# Patient Record
Sex: Female | Born: 1985
Health system: Southern US, Community
[De-identification: ages and names within clinical notes are randomized; demographics above are authoritative.]

## PROBLEM LIST (undated history)

## (undated) DIAGNOSIS — R51 Headache: Secondary | ICD-10-CM

## (undated) DIAGNOSIS — E669 Obesity, unspecified: Secondary | ICD-10-CM

## (undated) DIAGNOSIS — R519 Headache, unspecified: Secondary | ICD-10-CM

## (undated) DIAGNOSIS — M549 Dorsalgia, unspecified: Secondary | ICD-10-CM

## (undated) DIAGNOSIS — N9489 Other specified conditions associated with female genital organs and menstrual cycle: Secondary | ICD-10-CM

## (undated) HISTORY — DX: Headache: R51

## (undated) HISTORY — PX: BARTHOLIN GLAND CYST EXCISION: SHX565

## (undated) HISTORY — DX: Other specified conditions associated with female genital organs and menstrual cycle: N94.89

## (undated) HISTORY — DX: Headache, unspecified: R51.9

---

## 2009-04-13 ENCOUNTER — Emergency Department (HOSPITAL_BASED_OUTPATIENT_CLINIC_OR_DEPARTMENT_OTHER): Admission: EM | Admit: 2009-04-13 | Discharge: 2009-04-13 | Payer: Self-pay | Admitting: Emergency Medicine

## 2009-04-14 ENCOUNTER — Emergency Department (HOSPITAL_BASED_OUTPATIENT_CLINIC_OR_DEPARTMENT_OTHER): Admission: EM | Admit: 2009-04-14 | Discharge: 2009-04-14 | Payer: Self-pay | Admitting: Emergency Medicine

## 2010-04-26 ENCOUNTER — Emergency Department (HOSPITAL_BASED_OUTPATIENT_CLINIC_OR_DEPARTMENT_OTHER): Admission: EM | Admit: 2010-04-26 | Discharge: 2010-04-26 | Payer: Self-pay | Admitting: Emergency Medicine

## 2010-06-29 ENCOUNTER — Emergency Department (HOSPITAL_BASED_OUTPATIENT_CLINIC_OR_DEPARTMENT_OTHER): Admission: EM | Admit: 2010-06-29 | Discharge: 2010-06-30 | Payer: Self-pay | Admitting: Emergency Medicine

## 2010-06-30 ENCOUNTER — Ambulatory Visit: Payer: Self-pay | Admitting: Interventional Radiology

## 2010-12-15 ENCOUNTER — Emergency Department (HOSPITAL_BASED_OUTPATIENT_CLINIC_OR_DEPARTMENT_OTHER)
Admission: EM | Admit: 2010-12-15 | Discharge: 2010-12-15 | Payer: Self-pay | Source: Home / Self Care | Admitting: Emergency Medicine

## 2010-12-16 ENCOUNTER — Emergency Department (HOSPITAL_BASED_OUTPATIENT_CLINIC_OR_DEPARTMENT_OTHER)
Admission: EM | Admit: 2010-12-16 | Discharge: 2010-12-16 | Payer: Self-pay | Source: Home / Self Care | Admitting: Emergency Medicine

## 2010-12-20 LAB — CBC
HCT: 39 % (ref 36.0–46.0)
MCH: 28.5 pg (ref 26.0–34.0)
MCHC: 32.8 g/dL (ref 30.0–36.0)
MCV: 86.9 fL (ref 78.0–100.0)

## 2011-02-14 LAB — URINE MICROSCOPIC-ADD ON

## 2011-02-14 LAB — URINALYSIS, ROUTINE W REFLEX MICROSCOPIC
Bilirubin Urine: NEGATIVE
Glucose, UA: NEGATIVE mg/dL
Nitrite: NEGATIVE
Protein, ur: NEGATIVE mg/dL
Urobilinogen, UA: 1 mg/dL (ref 0.0–1.0)
pH: 6 (ref 5.0–8.0)

## 2011-02-14 LAB — PREGNANCY, URINE: Preg Test, Ur: NEGATIVE

## 2011-08-12 ENCOUNTER — Encounter: Payer: Self-pay | Admitting: Family Medicine

## 2011-08-12 ENCOUNTER — Emergency Department (HOSPITAL_BASED_OUTPATIENT_CLINIC_OR_DEPARTMENT_OTHER)
Admission: EM | Admit: 2011-08-12 | Discharge: 2011-08-12 | Disposition: A | Discharge status: Home / Self Care | Payer: Managed Care, Other (non HMO) | Attending: Emergency Medicine | Admitting: Emergency Medicine

## 2011-08-12 DIAGNOSIS — R21 Rash and other nonspecific skin eruption: Secondary | ICD-10-CM | POA: Insufficient documentation

## 2011-08-12 DIAGNOSIS — L509 Urticaria, unspecified: Secondary | ICD-10-CM | POA: Insufficient documentation

## 2011-08-12 MED ORDER — PREDNISONE 20 MG PO TABS
100.0000 mg | ORAL_TABLET | Freq: Once | ORAL | Status: AC
  Administered 2011-08-12: 100 mg via ORAL
  Filled 2011-08-12: qty 5

## 2011-08-12 MED ORDER — PREDNISONE 10 MG PO TABS: 50.0000 mg | ORAL_TABLET | Freq: Two times a day (BID) | ORAL | Status: AC

## 2011-08-12 MED ORDER — HYDROXYZINE HCL 25 MG PO TABS
25.0000 mg | ORAL_TABLET | Freq: Once | ORAL | Status: AC
  Administered 2011-08-12: 25 mg via ORAL
  Filled 2011-08-12: qty 1

## 2011-08-12 NOTE — ED Notes (Signed)
MD at bedside. 

## 2011-08-12 NOTE — ED Notes (Signed)
Pt c/o severe itching and hives since this morning. Pt sts she had similar incident last week and "they went away".

## 2011-08-12 NOTE — ED Provider Notes (Addendum)
History     CSN: [id number] Arrival date & time: 08/12/2011  7:46 AM   Chief Complaint  Patient presents with  . Urticaria     (Include location/radiation/quality/duration/timing/severity/associated sxs/prior treatment) HPI  The patient is a 25 year old female with no significant past medical history.  She presents to the emergency department complaining of upper rash for approximately one week.  She denies tightness in her throat, shortness of breath, lightheadedness, nausea, vomiting, or abdominal pain.  She denies a cough.  She does not know of any exposures recently.  She has not changed her diet.  She has not changed any of her detergents or beauty products.  She has not had similar reactions in the past.  She has been using Benadryl.  However, she has not gotten any relief from the itching.   History reviewed. No pertinent past medical history.   Past Surgical History  Procedure Date  . Bartholin gland cyst excision     No family history on file.  History  Substance Use Topics  . Smoking status: Never Smoker   . Smokeless tobacco: Not on file  . Alcohol Use: No    OB History    Grav Para Term Preterm Abortions TAB SAB Ect Mult Living                  Review of Systems  HENT: Positive for congestion. Negative for rhinorrhea and sneezing.   Eyes: Negative for itching.  Respiratory: Negative for cough, chest tightness and shortness of breath.   Cardiovascular: Negative for chest pain and palpitations.  Gastrointestinal: Negative for nausea, vomiting and abdominal pain.  Skin: Positive for rash.       Itching rash    Allergies  Review of patient's allergies indicates no known allergies.  Home Medications   Current Outpatient Rx  Name Route Sig Dispense Refill  . PREDNISONE 10 MG PO TABS Oral Take 5 tablets (50 mg total) by mouth 2 (two) times daily. 10 tablet 0    Physical Exam    BP 148/91  Pulse 100  Temp(Src) 98.3 F (36.8 C) (Oral)  Resp 18  Ht  5\' 9"  (1.753 m)  Wt 290 lb (131.543 kg)  BMI 42.83 kg/m2  SpO2 100%  Physical Exam  Constitutional: She is oriented to person, place, and time. She appears well-developed and well-nourished.       Morbidly obese scratching  HENT:  Head: Normocephalic and atraumatic.  Mouth/Throat: Oropharynx is clear and moist. No oropharyngeal exudate.       No edema in the oropharynx  Eyes: Pupils are equal, round, and reactive to light.  Neck: Normal range of motion.  Cardiovascular: Normal rate, regular rhythm and normal heart sounds.   No murmur heard. Pulmonary/Chest: Effort normal and breath sounds normal. No respiratory distress. She has no wheezes. She has no rales.  Abdominal: She exhibits no distension and no mass. There is no tenderness. There is no rebound and no guarding.  Musculoskeletal: Normal range of motion. She exhibits no edema and no tenderness.  Neurological: She is alert and oriented to person, place, and time. No cranial nerve deficit.  Skin: Rash noted. No erythema.       Hives  Psychiatric: She has a normal mood and affect. Her behavior is normal.    ED Course  Procedures  Hives with an unknown etiology.  No signs of anaphylaxis.  No testing or intervention in the ED necessary.  Other than oral steroids  Results for orders  placed during the hospital encounter of 12/16/10  GLUCOSE, CAPILLARY      Component Value Range   Glucose-Capillary 88  70 - 99 (mg/dL)  CBC      Component Value Range   WBC 9.0  4.0 - 10.5 (K/uL)   RBC 4.49  3.87 - 5.11 (MIL/uL)   Hemoglobin 12.8  12.0 - 15.0 (g/dL)   HCT 16.1  09.6 - 04.5 (%)   MCV 86.9  78.0 - 100.0 (fL)   MCH 28.5  26.0 - 34.0 (pg)   MCHC 32.8  30.0 - 36.0 (g/dL)   RDW 40.9  81.1 - 91.4 (%)   Platelets 353  150 - 400 (K/uL)   No results found.   1. Hives      MDM Urticaria No signs of anaphylaxis       Nicholes Stairs, MD 08/12/11 7829  Nicholes Stairs, MD 08/12/11 [phone]

## 2011-11-13 ENCOUNTER — Encounter (HOSPITAL_BASED_OUTPATIENT_CLINIC_OR_DEPARTMENT_OTHER): Payer: Self-pay | Admitting: Emergency Medicine

## 2011-11-13 ENCOUNTER — Emergency Department (HOSPITAL_BASED_OUTPATIENT_CLINIC_OR_DEPARTMENT_OTHER)
Admission: EM | Admit: 2011-11-13 | Discharge: 2011-11-13 | Disposition: A | Discharge status: Home / Self Care | Payer: Managed Care, Other (non HMO) | Attending: Emergency Medicine | Admitting: Emergency Medicine

## 2011-11-13 DIAGNOSIS — R1013 Epigastric pain: Secondary | ICD-10-CM | POA: Insufficient documentation

## 2011-11-13 DIAGNOSIS — K529 Noninfective gastroenteritis and colitis, unspecified: Secondary | ICD-10-CM

## 2011-11-13 DIAGNOSIS — R112 Nausea with vomiting, unspecified: Secondary | ICD-10-CM | POA: Insufficient documentation

## 2011-11-13 DIAGNOSIS — K5289 Other specified noninfective gastroenteritis and colitis: Secondary | ICD-10-CM | POA: Insufficient documentation

## 2011-11-13 LAB — URINALYSIS, ROUTINE W REFLEX MICROSCOPIC
Glucose, UA: NEGATIVE mg/dL
Leukocytes, UA: NEGATIVE
Nitrite: NEGATIVE
Specific Gravity, Urine: 1.024 (ref 1.005–1.030)
Urobilinogen, UA: 0.2 mg/dL (ref 0.0–1.0)

## 2011-11-13 LAB — PREGNANCY, URINE: Preg Test, Ur: NEGATIVE

## 2011-11-13 LAB — COMPREHENSIVE METABOLIC PANEL
ALT: 42 U/L — ABNORMAL HIGH (ref 0–35)
Albumin: 3.9 g/dL (ref 3.5–5.2)
Alkaline Phosphatase: 91 U/L (ref 39–117)
BUN: 12 mg/dL (ref 6–23)
CO2: 26 mEq/L (ref 19–32)
Calcium: 9.4 mg/dL (ref 8.4–10.5)
Chloride: 105 mEq/L (ref 96–112)
GFR calc Af Amer: >90 mL/min (ref >90)

## 2011-11-13 LAB — CBC
HCT: 38.8 % (ref 36.0–46.0)
Hemoglobin: 12.9 g/dL (ref 12.0–15.0)
MCV: 87 fL (ref 78.0–100.0)
RDW: 14.5 % (ref 11.5–15.5)

## 2011-11-13 LAB — DIFFERENTIAL
Basophils Absolute: 0 10*3/uL (ref 0.0–0.1)
Monocytes Absolute: 0.8 10*3/uL (ref 0.1–1.0)

## 2011-11-13 LAB — LIPASE, BLOOD: Lipase: 22 U/L (ref 11–59)

## 2011-11-13 MED ORDER — ONDANSETRON 8 MG PO TBDP: 8.0000 mg | ORAL_TABLET | Freq: Three times a day (TID) | ORAL | Status: AC | PRN

## 2011-11-13 MED ORDER — HYDROMORPHONE HCL PF 1 MG/ML IJ SOLN
1.0000 mg | Freq: Once | INTRAMUSCULAR | Status: AC
  Administered 2011-11-13: 1 mg via INTRAVENOUS
  Filled 2011-11-13: qty 1

## 2011-11-13 MED ORDER — HYDROCODONE-ACETAMINOPHEN 5-325 MG PO TABS: 1.0000 | ORAL_TABLET | Freq: Four times a day (QID) | ORAL | Status: AC | PRN

## 2011-11-13 MED ORDER — SODIUM CHLORIDE 0.9 % IV BOLUS (SEPSIS)
1000.0000 mL | Freq: Once | INTRAVENOUS | Status: AC
  Administered 2011-11-13: 1000 mL via INTRAVENOUS

## 2011-11-13 MED ORDER — ONDANSETRON HCL 4 MG/2ML IJ SOLN
4.0000 mg | Freq: Once | INTRAMUSCULAR | Status: AC
  Administered 2011-11-13: 4 mg via INTRAVENOUS
  Filled 2011-11-13 (×2): qty 2

## 2011-11-13 NOTE — ED Notes (Signed)
N/V/D and abdominal pain since this am.  No known fever.  Abdominal cramping radiating through to back.  No dysuria.

## 2011-11-13 NOTE — ED Notes (Signed)
Care plan reviewed family at side

## 2011-11-13 NOTE — ED Provider Notes (Signed)
History     CSN: [id number] Arrival date & time: 11/13/2011 12:42 PM   First MD Initiated Contact with Patient 11/13/11 1253      1:15 PM HPI This reports being woken by her daughter this morning and discovered she had upper abdominal pain that radiates to her entire abdomen. Reports acutely became nauseous and vomited. Also reports having diarrhea. Denies history of surgeries, similar symptoms, urinary symptoms, vaginal symptoms, back pain, fever,  sick contacts, food contacts or recent foreign travel.   Patient is a 25 y.o. female presenting with abdominal pain. The history is provided by the patient.  Abdominal Pain The primary symptoms of the illness include abdominal pain, nausea, vomiting and diarrhea. The primary symptoms of the illness do not include fever, fatigue, shortness of breath, hematemesis, dysuria, vaginal discharge or vaginal bleeding. The current episode started 3 to 5 hours ago. The problem has been gradually worsening.  The abdominal pain began 3 to 5 hours ago. The abdominal pain has been gradually worsening since its onset. The abdominal pain is generalized. The abdominal pain radiates to the back. The abdominal pain is relieved by nothing.  The patient states that she believes she is currently not pregnant (NO menses Due to Implanon). The patient has not had a change in bowel habit. Additional symptoms associated with the illness include anorexia and back pain. Symptoms associated with the illness do not include chills, heartburn, constipation, urgency, hematuria or frequency.    History reviewed. No pertinent past medical history.  Past Surgical History  Procedure Date  . Bartholin gland cyst excision     History reviewed. No pertinent family history.  History  Substance Use Topics  . Smoking status: Never Smoker   . Smokeless tobacco: Not on file  . Alcohol Use: No    OB History    Grav Para Term Preterm Abortions TAB SAB Ect Mult Living                    Review of Systems  Constitutional: Negative for fever, chills and fatigue.  Respiratory: Negative for shortness of breath.   Cardiovascular: Negative for chest pain.  Gastrointestinal: Positive for nausea, vomiting, abdominal pain, diarrhea and anorexia. Negative for heartburn, constipation and hematemesis.  Genitourinary: Negative for dysuria, urgency, frequency, hematuria, flank pain, vaginal bleeding, vaginal discharge and vaginal pain.  Musculoskeletal: Positive for back pain.  All other systems reviewed and are negative.    Allergies  Review of patient's allergies indicates no known allergies.  Home Medications  No current outpatient prescriptions on file.  BP 132/73  Pulse 97  Temp(Src) 98.5 F (36.9 C) (Oral)  Resp 16  Ht 5\' 9"  (1.753 m)  Wt 285 lb (129.275 kg)  BMI 42.09 kg/m2  SpO2 98%  Physical Exam  Vitals reviewed. Constitutional: She is oriented to person, place, and time. Vital signs are normal. She appears well-developed and well-nourished.  HENT:  Head: Normocephalic and atraumatic.  Eyes: Conjunctivae are normal. Pupils are equal, round, and reactive to light.  Neck: Normal range of motion. Neck supple.  Cardiovascular: Normal rate, regular rhythm and normal heart sounds.  Exam reveals no friction rub.   No murmur heard. Pulmonary/Chest: Effort normal and breath sounds normal. She has no wheezes. She has no rhonchi. She has no rales. She exhibits no tenderness.  Abdominal: Soft. Normal appearance and bowel sounds are normal. She exhibits no distension and no mass. There is no hepatosplenomegaly. There is generalized tenderness. There is no rebound,  no guarding, no CVA tenderness, no tenderness at McBurney's point and negative Murphy's sign.       Abdominal pain more severe in left upper quadrant and epigastric. Patient is  Obese.   Musculoskeletal: Normal range of motion.  Neurological: She is alert and oriented to person, place, and time. Coordination  normal.  Skin: Skin is warm and dry. No rash noted. No erythema. No pallor.    ED Course  Procedures   MDM   Discussed normal lab results with patient. Reports pain and nausea has improved with medication. Exam is benign. Advise likely has a gastroenteritis. Will prescribe analgesics and antiemetics. Advised immediate return for worsening symptoms. Patient agrees with plan and is ready for discharge.     Thomasene Lot, [place] 11/13/11 1700

## 2011-11-14 NOTE — ED Provider Notes (Signed)
Medical screening examination/treatment/procedure(s) were performed by non-physician practitioner and as supervising physician I was immediately available for consultation/collaboration.  Gerhard Munch, MD 11/14/11 [phone]

## 2012-04-01 ENCOUNTER — Emergency Department (HOSPITAL_BASED_OUTPATIENT_CLINIC_OR_DEPARTMENT_OTHER)
Admission: EM | Admit: 2012-04-01 | Discharge: 2012-04-01 | Disposition: A | Discharge status: Home / Self Care | Payer: Managed Care, Other (non HMO) | Attending: Emergency Medicine | Admitting: Emergency Medicine

## 2012-04-01 ENCOUNTER — Encounter (HOSPITAL_BASED_OUTPATIENT_CLINIC_OR_DEPARTMENT_OTHER): Payer: Self-pay

## 2012-04-01 DIAGNOSIS — M545 Low back pain, unspecified: Secondary | ICD-10-CM | POA: Insufficient documentation

## 2012-04-01 DIAGNOSIS — M5380 Other specified dorsopathies, site unspecified: Secondary | ICD-10-CM | POA: Insufficient documentation

## 2012-04-01 DIAGNOSIS — M62830 Muscle spasm of back: Secondary | ICD-10-CM

## 2012-04-01 HISTORY — DX: Dorsalgia, unspecified: M54.9

## 2012-04-01 HISTORY — DX: Obesity, unspecified: E66.9

## 2012-04-01 MED ORDER — DIAZEPAM 5 MG PO TABS: 5.0000 mg | ORAL_TABLET | Freq: Three times a day (TID) | ORAL | Status: AC | PRN

## 2012-04-01 MED ORDER — KETOROLAC TROMETHAMINE 60 MG/2ML IM SOLN
60.0000 mg | Freq: Once | INTRAMUSCULAR | Status: AC
  Administered 2012-04-01: 60 mg via INTRAMUSCULAR
  Filled 2012-04-01: qty 2

## 2012-04-01 MED ORDER — HYDROMORPHONE HCL PF 1 MG/ML IJ SOLN
1.0000 mg | Freq: Once | INTRAMUSCULAR | Status: AC
  Administered 2012-04-01: 1 mg via INTRAMUSCULAR
  Filled 2012-04-01: qty 1

## 2012-04-01 MED ORDER — HYDROCODONE-ACETAMINOPHEN 5-325 MG PO TABS: ORAL_TABLET | ORAL | Status: AC

## 2012-04-01 MED ORDER — DIAZEPAM 5 MG PO TABS
10.0000 mg | ORAL_TABLET | Freq: Once | ORAL | Status: AC
  Administered 2012-04-01: 10 mg via ORAL
  Filled 2012-04-01: qty 2

## 2012-04-01 NOTE — ED Notes (Signed)
Ghim MD at bedside. 

## 2012-04-01 NOTE — ED Provider Notes (Signed)
History  This chart was scribed for Joan Harris. Oletta Lamas, MD by Bennett Scrape. This patient was seen in room MH04/MH04 and the patient's care was started at 1658.  CSN: [id number]  Arrival date & time 04/01/12  1636   First MD Initiated Contact with Patient 04/01/12 1658      Chief Complaint  Patient presents with  . Back Pain     The history is provided by the patient. No language interpreter was used.    Joan Harris is a 26 y.o. female who presents to the Emergency Department complaining of sudden onset, gradually worsening, constant right lower back pain that started approximately 30 minutes PTA. Pt states that she was in the self check-out line buying  rotisserie chicken when she felt sharp, cramping pain while attempting to swipe her debit card. She denies feeling any pain while carrying or bagging the chicken. She reports that the pain worsened while she was walking to the car. She denies taking any medications PTA. She denies any recent heavy lifting but states that she was doing some bending yesterday while doing laundry. She denies numbness, weakness or loss of bowel or bladder as associated symptoms. She admits that she has chronic upper back pain from being "heavyset" but denies that this pain is similar to her complaint. She has no h/o chronic medical conditions. She denies smoking and alcohol use.   Past Medical History  Diagnosis Date  . Back pain   . Obesity     Past Surgical History  Procedure Date  . Bartholin gland cyst excision     History reviewed. No pertinent family history.  History  Substance Use Topics  . Smoking status: Never Smoker   . Smokeless tobacco: Not on file  . Alcohol Use: No     Review of Systems  Constitutional: Negative for fever and chills.  Genitourinary: Negative for dysuria, hematuria and difficulty urinating.  Musculoskeletal: Positive for back pain.  Skin: Negative for rash.  Neurological: Negative for weakness and  numbness.    Allergies  Review of patient's allergies indicates no known allergies.  Home Medications   Current Outpatient Rx  Name Route Sig Dispense Refill  . DIAZEPAM 5 MG PO TABS Oral Take 1 tablet (5 mg total) by mouth every 8 (eight) hours as needed for anxiety. 10 tablet 0  . HYDROCODONE-ACETAMINOPHEN 5-325 MG PO TABS  1-2 tablets po q 6 hours prn moderate to severe pain 20 tablet 0    Triage Vitals: BP 122/70  Pulse 94  Temp(Src) 98.3 F (36.8 C) (Oral)  Resp 30  Ht 5\' 10"  (1.778 m)  Wt 350 lb (158.759 kg)  BMI 50.22 kg/m2  SpO2 100%  Physical Exam  Nursing note and vitals reviewed. Constitutional: She is oriented to person, place, and time. She appears well-developed and well-nourished. She does not appear ill. No distress.  HENT:  Head: Normocephalic and atraumatic.  Eyes: Conjunctivae and EOM are normal.  Neck: Normal range of motion. Neck supple.  Cardiovascular: Normal rate.   Pulmonary/Chest: Effort normal. No respiratory distress.  Abdominal: Soft. Bowel sounds are normal. She exhibits no distension. There is no tenderness.  Musculoskeletal: Normal range of motion. She exhibits no edema.       Back spasms in the right thoracic and right lumbar regions  Neurological: She is alert and oriented to person, place, and time. She has normal strength. No sensory deficit. Coordination normal.  Reflex Scores:      Patellar reflexes are 2+ on  the right side and 2+ on the left side.      DTR 2+ symmetrically   Skin: Skin is warm and dry. No rash noted.    ED Course  Procedures (including critical care time)  DIAGNOSTIC STUDIES: Oxygen Saturation is 100% on room air, normal by my interpretation.    COORDINATION OF CARE: 7:30PM-Discussed pain shots and muscle relaxers with pt and pt agreed. Will discharge with pain medication prescription.  Labs Reviewed - No data to display No results found.   1. Back spasm      10:06 PM Pt feels improved, able to  stand, walk without difficulty, reports feeling improved.   MDM  I personally performed the services described in this documentation, which was scribed in my presence. The recorded information has been reviewed and considered.   Pt clincially with muscular spasms, wil give analgesics, NSAID, valium and reassess.     Joan Harris. Giordan Fordham, MD 04/01/12 1610

## 2012-04-01 NOTE — Discharge Instructions (Signed)
Narcotic and benzodiazepine use may cause drowsiness, slowed breathing or dependence.  Please use with caution and do not drive, operate machinery or watch young children alone while taking them.  Taking combinations of these medications or drinking alcohol will potentiate these effects.    

## 2012-04-01 NOTE — ED Notes (Signed)
Pt states that she was at the store trying to swipe her debit card and she had sudden onset of back pain in the R lower back.  Pt is sobbing at time of triage, sitting in wheelchair leaning to L side.  Pt denies numbness or tingling in her legs, loss of bowel/bladder control.

## 2012-04-01 NOTE — ED Notes (Signed)
ambulated patient with no assistance around room and into hall, patient states feels much better.

## 2013-02-05 ENCOUNTER — Encounter (HOSPITAL_BASED_OUTPATIENT_CLINIC_OR_DEPARTMENT_OTHER): Payer: Self-pay | Admitting: *Deleted

## 2013-02-05 ENCOUNTER — Emergency Department (HOSPITAL_BASED_OUTPATIENT_CLINIC_OR_DEPARTMENT_OTHER)
Admission: EM | Admit: 2013-02-05 | Discharge: 2013-02-05 | Disposition: A | Discharge status: Home / Self Care | Payer: Managed Care, Other (non HMO) | Attending: Emergency Medicine | Admitting: Emergency Medicine

## 2013-02-05 ENCOUNTER — Emergency Department (HOSPITAL_BASED_OUTPATIENT_CLINIC_OR_DEPARTMENT_OTHER): Payer: Managed Care, Other (non HMO)

## 2013-02-05 DIAGNOSIS — R630 Anorexia: Secondary | ICD-10-CM | POA: Insufficient documentation

## 2013-02-05 DIAGNOSIS — Z3202 Encounter for pregnancy test, result negative: Secondary | ICD-10-CM | POA: Insufficient documentation

## 2013-02-05 DIAGNOSIS — E669 Obesity, unspecified: Secondary | ICD-10-CM | POA: Insufficient documentation

## 2013-02-05 DIAGNOSIS — R911 Solitary pulmonary nodule: Secondary | ICD-10-CM

## 2013-02-05 DIAGNOSIS — I880 Nonspecific mesenteric lymphadenitis: Secondary | ICD-10-CM

## 2013-02-05 DIAGNOSIS — R110 Nausea: Secondary | ICD-10-CM | POA: Insufficient documentation

## 2013-02-05 DIAGNOSIS — Z8739 Personal history of other diseases of the musculoskeletal system and connective tissue: Secondary | ICD-10-CM | POA: Insufficient documentation

## 2013-02-05 LAB — WET PREP, GENITAL
Trich, Wet Prep: NONE SEEN
Yeast Wet Prep HPF POC: NONE SEEN

## 2013-02-05 LAB — CBC WITH DIFFERENTIAL/PLATELET
Basophils Absolute: 0 10*3/uL (ref 0.0–0.1)
Basophils Relative: 0 % (ref 0–1)
Eosinophils Absolute: 0.1 10*3/uL (ref 0.0–0.7)
Eosinophils Relative: 1 % (ref 0–5)
Lymphocytes Relative: 49 % — ABNORMAL HIGH (ref 12–46)
MCHC: 33 g/dL (ref 30.0–36.0)
MCV: 89 fL (ref 78.0–100.0)
Platelets: 341 10*3/uL (ref 150–400)
RDW: 14.3 % (ref 11.5–15.5)
WBC: 7.9 10*3/uL (ref 4.0–10.5)

## 2013-02-05 LAB — COMPREHENSIVE METABOLIC PANEL
AST: 23 U/L (ref 0–37)
BUN: 12 mg/dL (ref 6–23)
CO2: 27 mEq/L (ref 19–32)
Chloride: 104 mEq/L (ref 96–112)
Creatinine, Ser: 0.9 mg/dL (ref 0.50–1.10)
GFR calc non Af Amer: 86 mL/min — ABNORMAL LOW (ref >90)
Total Bilirubin: 0.2 mg/dL — ABNORMAL LOW (ref 0.3–1.2)

## 2013-02-05 LAB — URINALYSIS, ROUTINE W REFLEX MICROSCOPIC
Ketones, ur: NEGATIVE mg/dL
Nitrite: NEGATIVE
Protein, ur: NEGATIVE mg/dL
Specific Gravity, Urine: 1.025 (ref 1.005–1.030)
Urobilinogen, UA: 0.2 mg/dL (ref 0.0–1.0)
pH: 5.5 (ref 5.0–8.0)

## 2013-02-05 LAB — URINE MICROSCOPIC-ADD ON

## 2013-02-05 MED ORDER — ONDANSETRON 8 MG PO TBDP: 8.0000 mg | ORAL_TABLET | Freq: Three times a day (TID) | ORAL | Status: DC | PRN

## 2013-02-05 MED ORDER — SODIUM CHLORIDE 0.9 % IV SOLN
1000.0000 mL | Freq: Once | INTRAVENOUS | Status: AC
  Administered 2013-02-05: 1000 mL via INTRAVENOUS

## 2013-02-05 MED ORDER — IOHEXOL 300 MG/ML  SOLN
100.0000 mL | Freq: Once | INTRAMUSCULAR | Status: AC | PRN
  Administered 2013-02-05: 100 mL via INTRAVENOUS

## 2013-02-05 MED ORDER — HYDROCODONE-ACETAMINOPHEN 5-325 MG PO TABS: 1.0000 | ORAL_TABLET | Freq: Four times a day (QID) | ORAL | Status: DC | PRN

## 2013-02-05 MED ORDER — DEXTROSE 5 % IV SOLN
250.0000 mg | INTRAVENOUS | Status: DC
  Filled 2013-02-05: qty 250

## 2013-02-05 MED ORDER — IOHEXOL 300 MG/ML  SOLN
50.0000 mL | Freq: Once | INTRAMUSCULAR | Status: AC | PRN
  Administered 2013-02-05: 50 mL via ORAL

## 2013-02-05 MED ORDER — NAPROXEN 500 MG PO TABS: 500.0000 mg | ORAL_TABLET | Freq: Two times a day (BID) | ORAL | Status: DC

## 2013-02-05 MED ORDER — AZITHROMYCIN 1 G PO PACK
1.0000 g | PACK | Freq: Once | ORAL | Status: AC
  Administered 2013-02-05: 1 g via ORAL
  Filled 2013-02-05: qty 1

## 2013-02-05 MED ORDER — CEFTRIAXONE SODIUM 250 MG IJ SOLR
INTRAMUSCULAR | Status: AC
  Administered 2013-02-05: 250 mg via INTRAVASCULAR
  Filled 2013-02-05: qty 250

## 2013-02-05 MED ORDER — HYDROMORPHONE HCL PF 1 MG/ML IJ SOLN
1.0000 mg | Freq: Once | INTRAMUSCULAR | Status: AC
  Administered 2013-02-05: 1 mg via INTRAVENOUS
  Filled 2013-02-05: qty 1

## 2013-02-05 MED ORDER — ONDANSETRON HCL 4 MG/2ML IJ SOLN
4.0000 mg | Freq: Once | INTRAMUSCULAR | Status: AC
  Administered 2013-02-05: 4 mg via INTRAVENOUS
  Filled 2013-02-05: qty 2

## 2013-02-05 MED ORDER — SODIUM CHLORIDE 0.9 % IV SOLN
1000.0000 mL | INTRAVENOUS | Status: DC
  Administered 2013-02-05: 1000 mL via INTRAVENOUS

## 2013-02-05 NOTE — ED Notes (Signed)
Abdominal pain described as epigastric area states started about 3 days ago also has decreased appetite  Denies vomiting or diarrhea denies fever or chills does report intermittent nausea

## 2013-02-05 NOTE — ED Provider Notes (Signed)
History    CSN: [id number] Arrival date & time 02/05/13  4098 First MD Initiated Contact with Patient 02/05/13 0845     Chief Complaint  Patient presents with  . Abdominal Pain   Patient is a 27 y.o. female presenting with abdominal pain. The history is provided by the patient.  Abdominal Pain Pain location:  Epigastric Pain quality: aching   Pain radiates to:  Back Pain severity:  Severe Onset quality:  Gradual Timing:  Constant Progression:  Worsening Chronicity:  New Context: not alcohol use, not diet changes, not recent illness and not sick contacts   Relieved by:  Nothing Worsened by:  Eating, movement and palpation Associated symptoms: anorexia and nausea   Associated symptoms: no chest pain, no cough, no diarrhea, no dysuria, no fever, no hematuria, no vaginal bleeding and no vaginal discharge     Past Medical History  Diagnosis Date  . Back pain   . Obesity    Past Surgical History  Procedure Laterality Date  . Bartholin gland cyst excision     History reviewed. No pertinent family history.  History  Substance Use Topics  . Smoking status: Never Smoker   . Smokeless tobacco: Not on file  . Alcohol Use: No    OB History   Grav Para Term Preterm Abortions TAB SAB Ect Mult Living                  Review of Systems  Constitutional: Negative for fever.  Respiratory: Negative for cough.   Cardiovascular: Negative for chest pain.  Gastrointestinal: Positive for nausea, abdominal pain and anorexia. Negative for diarrhea.  Genitourinary: Negative for dysuria, hematuria, vaginal bleeding and vaginal discharge.  All other systems reviewed and are negative.    Allergies  Review of patient's allergies indicates no known allergies.  Home Medications   Current Outpatient Rx  Name  Route  Sig  Dispense  Refill  . HYDROcodone-acetaminophen (NORCO) 5-325 MG per tablet   Oral   Take 1-2 tablets by mouth every 6 (six) hours as needed for pain.   20 tablet    0   . naproxen (NAPROSYN) 500 MG tablet   Oral   Take 1 tablet (500 mg total) by mouth 2 (two) times daily.   30 tablet   0   . ondansetron (ZOFRAN ODT) 8 MG disintegrating tablet   Oral   Take 1 tablet (8 mg total) by mouth every 8 (eight) hours as needed for nausea.   20 tablet   0     BP 122/75  Pulse 85  Temp(Src) 98.1 F (36.7 C) (Oral)  Resp 18  SpO2 100%  Physical Exam  Nursing note and vitals reviewed. Constitutional: No distress.  Uncomfortable, morbidly obese  HENT:  Head: Normocephalic and atraumatic.  Right Ear: External ear normal.  Left Ear: External ear normal.  Eyes: Conjunctivae are normal. Right eye exhibits no discharge. Left eye exhibits no discharge. No scleral icterus.  Neck: Neck supple. No tracheal deviation present.  Cardiovascular: Normal rate, regular rhythm and intact distal pulses.   Pulmonary/Chest: Effort normal and breath sounds normal. No stridor. No respiratory distress. She has no wheezes. She has no rales.  Abdominal: Soft. Bowel sounds are normal. She exhibits no distension. There is tenderness in the epigastric area. There is no rebound, no guarding and no CVA tenderness. No hernia. Hernia confirmed negative in the right inguinal area and confirmed negative in the left inguinal area.  Slight tenderness diffusely but primarily  in the epigastric region  Genitourinary: Uterus is not tender. Cervix exhibits discharge. Cervix exhibits no motion tenderness and no friability. Right adnexum displays no mass and no tenderness. Left adnexum displays no mass and no tenderness. No erythema or bleeding around the vagina. No foreign body around the vagina. No signs of injury around the vagina.  Musculoskeletal: She exhibits no edema and no tenderness.  Neurological: She is alert. She has normal strength. No sensory deficit. Cranial nerve deficit:  no gross defecits noted. She exhibits normal muscle tone. She displays no seizure activity. Coordination  normal.  Skin: Skin is warm and dry. No rash noted. She is not diaphoretic.  Psychiatric: She has a normal mood and affect.    ED Course  Procedures (including critical care time) Medications  0.9 %  sodium chloride infusion (0 mLs Intravenous Stopped 02/05/13 1009)    Followed by  0.9 %  sodium chloride infusion (1,000 mLs Intravenous New Bag/Given 02/05/13 1009)  cefTRIAXone (ROCEPHIN) 250 mg in dextrose 5 % 50 mL IVPB (not administered)  azithromycin (ZITHROMAX) powder 1 g (not administered)  HYDROmorphone (DILAUDID) injection 1 mg (1 mg Intravenous Given 02/05/13 0926)  ondansetron (ZOFRAN) injection 4 mg (4 mg Intravenous Given 02/05/13 0925)  iohexol (OMNIPAQUE) 300 MG/ML solution 50 mL (50 mLs Oral Contrast Given 02/05/13 1252)  iohexol (OMNIPAQUE) 300 MG/ML solution 100 mL (100 mLs Intravenous Contrast Given 02/05/13 1252)   2:00 PM On repeat exam , pt still tender.  Hard to localize to one discrete area.  Tender in upper and lower abdomen. 2:00 PM Pelvic exam complete.  Dc noted but not significant ttp uterus or ovaries.  Will proceed with CT considering the severity of her pain.  Labs Reviewed  WET PREP, GENITAL - Abnormal; Notable for the following:    Clue Cells Wet Prep HPF POC MANY (*)    WBC, Wet Prep HPF POC TOO NUMEROUS TO COUNT (*)    All other components within normal limits  URINALYSIS, ROUTINE W REFLEX MICROSCOPIC - Abnormal; Notable for the following:    APPearance CLOUDY (*)    Leukocytes, UA SMALL (*)    All other components within normal limits  COMPREHENSIVE METABOLIC PANEL - Abnormal; Notable for the following:    Total Bilirubin 0.2 (*)    GFR calc non Af Amer 86 (*)    All other components within normal limits  CBC WITH DIFFERENTIAL - Abnormal; Notable for the following:    Lymphocytes Relative 49 (*)    All other components within normal limits  URINE MICROSCOPIC-ADD ON - Abnormal; Notable for the following:    Squamous Epithelial / LPF FEW (*)     Bacteria, UA MANY (*)    All other components within normal limits  GC/CHLAMYDIA PROBE AMP  PREGNANCY, URINE  LIPASE, BLOOD  RPR   Ct Abdomen Pelvis W Contrast  02/05/2013  *RADIOLOGY REPORT*  Clinical Data: Abdominal pain  CT ABDOMEN AND PELVIS WITH CONTRAST  Technique:  Multidetector CT imaging of the abdomen and pelvis was performed following the standard protocol during bolus administration of intravenous contrast.  Contrast: 100 milliliter Omnipaque-300 nonionic intravenous  Comparison: None.  Findings:  The lung bases are clear except for several areas of what appears to be focal pleural thickening.  There is a 5 ml nodular opacity abutting the pleura in the superior segment right lower lobe.  No focal liver lesions are identified.  There is no biliary duct dilatation.  Spleen, pancreas, and adrenals appear normal.  Kidneys shows fetal lobulation bilaterally, anatomic variant. There is no apparent renal mass or hydronephrosis on either side.  In the pelvis, there is no mass or fluid collection.  The appendix appears normal.  There are multiple lymph nodes in the right lower quadrant ranging in size from the small as 4 mm to as large as 1.3 x 1.1 cm in size. There are a few subcentimeter left retroperitoneal lymph nodes as well.  There are a few scattered sub centimeter lymph nodes throughout the mesentery as well as small inguinal lymph nodes bilaterally.  There is no bowel obstruction.  There is no free air or portal venous air.  There is a small ventral hernia containing only fat.  There is no ascites or abscess in the abdomen or pelvis.  By size criteria, there is no adenopathy, although the multiple lymph nodes in the right lower quadrant must be viewed as abnormal.  Aorta is nonaneurysmal.  There are no blastic or lytic bone lesions.  IMPRESSION: Multiple lymph nodes are seen in the abdomen, particularly in the right lower quadrant region.  Most likely etiology for this finding is mesenteric  adenitis. Lymphoma conceivably could present in this manner however.  If symptoms persist, follow-up CT to assess for lymph node progression would be a reasonable consideration.  No bowel obstruction.  No abscess.  Appendix appears normal.  There is a small ventral hernia containing only fat.  There is mild pleural thickening in the posterior lung bases. There is a 5 mm nodular opacity abutting the pleura in the right lower lobe.  Follow-up of these areas should be based on Fleischner Society guidelines.  If the patient is at high risk for bronchogenic carcinoma, follow- up chest CT at 6-12 months is recommended.  If the patient is at low risk for bronchogenic carcinoma, follow-up chest CT at 12 months is recommended.  This recommendation follows the consensus statement: Guidelines for Management of Small Pulmonary Nodules Detected on CT Scans: A Statement from the Fleischner Society as published in Radiology 2005; 237:395-400.   Original Report Authenticated By: Bretta Bang, M.D.      1. Mesenteric adenitis   2. Pulmonary nodule       MDM  I discussed the patient's CT scan and incidental pulmonary nodule findings.  I suspect the patient's symptoms are related to mesenteric adenitis. She does have clue cells and some discharge on her pelvic exam however her pain was more diffuse and not particularly located to the uterus. She  was given ceftriaxone and Zithromax to cover for that possibility though.   Will dc home on pain meds.  Discussed follow up on pulmonary nodule and re-evaluation of her abdominal lymphadenopathy.       Celene Kras, MD 02/05/13 [phone]

## 2013-03-14 ENCOUNTER — Encounter: Payer: Self-pay | Admitting: *Deleted

## 2013-03-14 DIAGNOSIS — G44009 Cluster headache syndrome, unspecified, not intractable: Secondary | ICD-10-CM | POA: Insufficient documentation

## 2013-03-27 ENCOUNTER — Ambulatory Visit (INDEPENDENT_AMBULATORY_CARE_PROVIDER_SITE_OTHER): Payer: Managed Care, Other (non HMO) | Admitting: Family Medicine

## 2013-03-27 ENCOUNTER — Encounter: Payer: Self-pay | Admitting: Family Medicine

## 2013-03-27 VITALS — BP 128/84 | HR 82 | Wt 292.0 lb

## 2013-03-27 DIAGNOSIS — M549 Dorsalgia, unspecified: Secondary | ICD-10-CM

## 2013-03-27 DIAGNOSIS — K219 Gastro-esophageal reflux disease without esophagitis: Secondary | ICD-10-CM

## 2013-03-27 MED ORDER — MELOXICAM 7.5 MG PO TABS: 7.5000 mg | ORAL_TABLET | Freq: Every day | ORAL | Status: AC

## 2013-03-27 MED ORDER — KETOROLAC TROMETHAMINE 30 MG/ML IJ SOLN
60.0000 mg | Freq: Once | INTRAMUSCULAR | Status: AC
  Administered 2013-03-27: 60 mg via INTRAMUSCULAR

## 2013-03-27 MED ORDER — TIZANIDINE HCL 4 MG PO TABS: 4.0000 mg | ORAL_TABLET | Freq: Three times a day (TID) | ORAL | Status: AC | PRN

## 2013-03-27 MED ORDER — PANTOPRAZOLE SODIUM 40 MG PO TBEC: 40.0000 mg | DELAYED_RELEASE_TABLET | Freq: Every day | ORAL | Status: DC

## 2013-03-27 NOTE — Patient Instructions (Addendum)
1)  Back Pain - Take the Mobic daily along with the muscle relaxer.  Order a Back Magic and use 1-2 times per day 10-15 mins.    Back Exercises Back exercises help treat and prevent back injuries. The goal of back exercises is to increase the strength of your abdominal and back muscles and the flexibility of your back. These exercises should be started when you no longer have back pain. Back exercises include:  Pelvic Tilt. Lie on your back with your knees bent. Tilt your pelvis until the lower part of your back is against the floor. Hold this position 5 to 10 sec and repeat 5 to 10 times.  Knee to Chest. Pull first 1 knee up against your chest and hold for 20 to 30 seconds, repeat this with the other knee, and then both knees. This may be done with the other leg straight or bent, whichever feels better.  Sit-Ups or Curl-Ups. Bend your knees 90 degrees. Start with tilting your pelvis, and do a partial, slow sit-up, lifting your trunk only 30 to 45 degrees off the floor. Take at least 2 to 3 seconds for each sit-up. Do not do sit-ups with your knees out straight. If partial sit-ups are difficult, simply do the above but with only tightening your abdominal muscles and holding it as directed.  Hip-Lift. Lie on your back with your knees flexed 90 degrees. Push down with your feet and shoulders as you raise your hips a couple inches off the floor; hold for 10 seconds, repeat 5 to 10 times.  Back arches. Lie on your stomach, propping yourself up on bent elbows. Slowly press on your hands, causing an arch in your low back. Repeat 3 to 5 times. Any initial stiffness and discomfort should lessen with repetition over time.  Shoulder-Lifts. Lie face down with arms beside your body. Keep hips and torso pressed to floor as you slowly lift your head and shoulders off the floor. Do not overdo your exercises, especially in the beginning. Exercises may cause you some mild back discomfort which lasts for a few  minutes; however, if the pain is more severe, or lasts for more than 15 minutes, do not continue exercises until you see your caregiver. Improvement with exercise therapy for back problems is slow.  See your caregivers for assistance with developing a proper back exercise program. Document Released: 12/22/2004 Document Revised: 02/06/2012 Document Reviewed: 09/15/2011 [facility] Patient Information 2013 [place], [place].

## 2013-03-27 NOTE — Progress Notes (Signed)
  Subjective:    Patient ID: Joan Harris, female    DOB: June 20, 1986, 27 y.o.   MRN: [id number]  HPI Fannye is here today to discuss a few issues.  1)  Back Pain:  She continues to struggle with back pain. She has been taking Norco as needed but has run out of this medication.  She needs her FMLA forms completed for this problem.   2)  Nausea and Vomiting:  She has been having some nausea and vomiting since yesterday after eating fast food.  Today she only has nausea.  She started her period and feels that these symptoms are associated with this.     Review of Systems  Gastrointestinal: Positive for nausea.  Genitourinary: Positive for menstrual problem and pelvic pain.  Musculoskeletal: Positive for back pain.       Objective:   Physical Exam  Constitutional: No distress.  HENT:  Nose: Nose normal.  Mouth/Throat: Oropharynx is clear and moist. Mucous membranes are dry.  Eyes: No scleral icterus.  Neck: Neck supple.  Cardiovascular: Normal rate, regular rhythm and normal heart sounds.   Pulmonary/Chest: Effort normal and breath sounds normal.  Abdominal: Soft. Bowel sounds are normal. She exhibits no distension and no mass. There is no tenderness. There is no rebound and no guarding.  Musculoskeletal: She exhibits tenderness (Low Back Pain ). She exhibits no edema.  Lymphadenopathy:    She has no cervical adenopathy.  Neurological: She is alert.  Skin: Skin is warm and dry. She is not diaphoretic.  Psychiatric: She has a normal mood and affect. Her behavior is normal. Judgment and thought content normal.      Assessment & Plan:   1)  Back Pain:  She received an injection of Toradol along with prescripitons for Mobic and tizanidine.    2)  GERD - She is to try some Protonix.

## 2013-05-24 ENCOUNTER — Ambulatory Visit (INDEPENDENT_AMBULATORY_CARE_PROVIDER_SITE_OTHER): Payer: Managed Care, Other (non HMO) | Admitting: Family Medicine

## 2013-05-24 VITALS — BP 114/76 | HR 76 | Ht 69.0 in | Wt 291.0 lb

## 2013-05-24 DIAGNOSIS — K219 Gastro-esophageal reflux disease without esophagitis: Secondary | ICD-10-CM

## 2013-05-24 DIAGNOSIS — R5383 Other fatigue: Secondary | ICD-10-CM

## 2013-05-24 DIAGNOSIS — M549 Dorsalgia, unspecified: Secondary | ICD-10-CM

## 2013-05-24 DIAGNOSIS — L83 Acanthosis nigricans: Secondary | ICD-10-CM

## 2013-05-24 DIAGNOSIS — R5381 Other malaise: Secondary | ICD-10-CM

## 2013-05-24 MED ORDER — PHENTERMINE-TOPIRAMATE ER 3.75-23 MG PO CP24: 1.0000 | ORAL_CAPSULE | Freq: Every day | ORAL | Status: DC

## 2013-05-24 MED ORDER — PHENTERMINE-TOPIRAMATE ER 7.5-46 MG PO CP24: 1.0000 | ORAL_CAPSULE | Freq: Every day | ORAL | Status: DC

## 2013-05-24 NOTE — Progress Notes (Signed)
  Subjective:    Patient ID: Joan Harris, female    DOB: 1986/09/07, 27 y.o.   MRN: [id number]  Katriona is here today with her boyfriend complaining of worsening back pain.     Back Pain This is a recurrent problem. The current episode started in the past 7 days. The problem occurs constantly. The problem has been gradually worsening since onset. The pain is present in the thoracic spine. The quality of the pain is described as stabbing. The pain does not radiate. The pain is at a severity of 8/10. The pain is severe. The pain is worse during the day. The symptoms are aggravated by bending and twisting. Pertinent negatives include no chest pain. Risk factors include obesity. She has tried home exercises and NSAIDs for the symptoms. The treatment provided no relief.    Review of Systems  Constitutional: Positive for activity change and fatigue.  HENT: Positive for neck pain.   Respiratory: Negative for chest tightness and shortness of breath.   Cardiovascular: Negative for chest pain and palpitations.  Musculoskeletal: Positive for back pain.  Psychiatric/Behavioral:       Stressed     Past Medical History  Diagnosis Date  . Back pain   . Obesity   . Persistent headaches     Family History  Problem Relation Age of Onset  . Heart disease Mother   . Hypertension Mother   . Hyperlipidemia Mother   . Hypertension Sister   . Asthma Daughter   . Cancer Maternal Grandmother     History   Social History Narrative   Marital Status: Single    Children:  Daughter Teacher, early years/pre)   Pets: None   Living Situation: Lives with her daughter   Occupation: Clinical biochemist Geographical information systems officer)   Education: Fish farm manager  Nurse, adult)    Tobacco Use:  She used to smoke Black & Mild 4 x per week. She quit 3 years ago.     Alcohol Use:  None   Drug Use:  None   Diet:  Regular   Exercise:  None   Hobbies: Makeup, Traveling        Objective:   Physical Exam  Vitals  reviewed. Constitutional: No distress.  Morbidly Obese   HENT:  Head: Normocephalic and atraumatic.  Eyes: Conjunctivae are normal. No scleral icterus.  Neck: Neck supple. No thyromegaly present.  Cardiovascular: Normal rate, regular rhythm and normal heart sounds.   Pulmonary/Chest: Effort normal and breath sounds normal.  Musculoskeletal: She exhibits tenderness.  Neurological: She is alert.  Skin: Skin is warm and dry. Rash (Acanthosis Nigricans ) noted.  Psychiatric: Her behavior is normal. Judgment and thought content normal.  She appears to be stressed/depressed.        Assessment & Plan:

## 2013-05-27 ENCOUNTER — Emergency Department (HOSPITAL_BASED_OUTPATIENT_CLINIC_OR_DEPARTMENT_OTHER)
Admission: EM | Admit: 2013-05-27 | Discharge: 2013-05-27 | Disposition: A | Discharge status: Home / Self Care | Payer: Managed Care, Other (non HMO) | Attending: Emergency Medicine | Admitting: Emergency Medicine

## 2013-05-27 ENCOUNTER — Emergency Department (HOSPITAL_BASED_OUTPATIENT_CLINIC_OR_DEPARTMENT_OTHER): Payer: Managed Care, Other (non HMO)

## 2013-05-27 ENCOUNTER — Encounter (HOSPITAL_BASED_OUTPATIENT_CLINIC_OR_DEPARTMENT_OTHER): Payer: Self-pay | Admitting: *Deleted

## 2013-05-27 DIAGNOSIS — R42 Dizziness and giddiness: Secondary | ICD-10-CM | POA: Insufficient documentation

## 2013-05-27 DIAGNOSIS — M549 Dorsalgia, unspecified: Secondary | ICD-10-CM | POA: Insufficient documentation

## 2013-05-27 DIAGNOSIS — R110 Nausea: Secondary | ICD-10-CM

## 2013-05-27 DIAGNOSIS — Z3202 Encounter for pregnancy test, result negative: Secondary | ICD-10-CM | POA: Insufficient documentation

## 2013-05-27 DIAGNOSIS — E669 Obesity, unspecified: Secondary | ICD-10-CM | POA: Insufficient documentation

## 2013-05-27 DIAGNOSIS — R0602 Shortness of breath: Secondary | ICD-10-CM | POA: Insufficient documentation

## 2013-05-27 DIAGNOSIS — R5383 Other fatigue: Secondary | ICD-10-CM | POA: Insufficient documentation

## 2013-05-27 DIAGNOSIS — R51 Headache: Secondary | ICD-10-CM | POA: Insufficient documentation

## 2013-05-27 DIAGNOSIS — R5381 Other malaise: Secondary | ICD-10-CM | POA: Insufficient documentation

## 2013-05-27 LAB — COMPREHENSIVE METABOLIC PANEL
ALT: 15 U/L (ref 0–35)
AST: 19 U/L (ref 0–37)
Albumin: 3.6 g/dL (ref 3.5–5.2)
Alkaline Phosphatase: 64 U/L (ref 39–117)
BUN: 11 mg/dL (ref 6–23)
Chloride: 102 mEq/L (ref 96–112)
Potassium: 3.3 mEq/L — ABNORMAL LOW (ref 3.5–5.1)
Sodium: 139 mEq/L (ref 135–145)
Total Bilirubin: 0.2 mg/dL — ABNORMAL LOW (ref 0.3–1.2)

## 2013-05-27 LAB — CBC WITH DIFFERENTIAL/PLATELET
Basophils Absolute: 0 10*3/uL (ref 0.0–0.1)
Basophils Relative: 0 % (ref 0–1)
Basophils Relative: 0 % (ref 0–1)
Eosinophils Absolute: 0.2 10*3/uL (ref 0.0–0.7)
Eosinophils Relative: 2 % (ref 0–5)
HCT: 35.7 % — ABNORMAL LOW (ref 36.0–46.0)
Hemoglobin: 12 g/dL (ref 12.0–15.0)
Hemoglobin: 12.4 g/dL (ref 12.0–15.0)
Lymphocytes Relative: 62 % — ABNORMAL HIGH (ref 12–46)
Lymphs Abs: 4.6 10*3/uL — ABNORMAL HIGH (ref 0.7–4.0)
MCH: 29.1 pg (ref 26.0–34.0)
MCHC: 33.6 g/dL (ref 30.0–36.0)
MCHC: 33.7 g/dL (ref 30.0–36.0)
MCV: 86.7 fL (ref 78.0–100.0)
Monocytes Absolute: 0.5 10*3/uL (ref 0.1–1.0)
Monocytes Relative: 7 % (ref 3–12)
Monocytes Relative: 6 % (ref 3–12)
Neutro Abs: 2.2 10*3/uL (ref 1.7–7.7)
Neutro Abs: 4.9 10*3/uL (ref 1.7–7.7)
Neutrophils Relative %: 29 % — ABNORMAL LOW (ref 43–77)
Neutrophils Relative %: 50 % (ref 43–77)
Platelets: 439 10*3/uL — ABNORMAL HIGH (ref 150–400)
Platelets: 413 10*3/uL — ABNORMAL HIGH (ref 150–400)
RBC: 4.12 MIL/uL (ref 3.87–5.11)
RBC: 4.17 MIL/uL (ref 3.87–5.11)
RDW: 14.2 % (ref 11.5–15.5)
WBC: 7.4 10*3/uL (ref 4.0–10.5)

## 2013-05-27 LAB — COMPLETE METABOLIC PANEL WITH GFR
ALT: 16 U/L (ref 0–35)
AST: 18 U/L (ref 0–37)
Albumin: 4.1 g/dL (ref 3.5–5.2)
Alkaline Phosphatase: 55 U/L (ref 39–117)
BUN: 10 mg/dL (ref 6–23)
CO2: 26 mEq/L (ref 19–32)
Calcium: 9.3 mg/dL (ref 8.4–10.5)
Chloride: 105 mEq/L (ref 96–112)
Creat: 0.88 mg/dL (ref 0.50–1.10)
GFR, Est African American: >89 mL/min
GFR, Est Non African American: >89 mL/min
Glucose, Bld: 78 mg/dL (ref 70–99)
Potassium: 4.5 mEq/L (ref 3.5–5.3)
Sodium: 140 mEq/L (ref 135–145)
Total Bilirubin: 0.3 mg/dL (ref 0.3–1.2)
Total Protein: 7.1 g/dL (ref 6.0–8.3)

## 2013-05-27 LAB — URINE MICROSCOPIC-ADD ON

## 2013-05-27 LAB — URINALYSIS, ROUTINE W REFLEX MICROSCOPIC
Ketones, ur: 15 mg/dL — AB
Leukocytes, UA: NEGATIVE
Nitrite: NEGATIVE
Specific Gravity, Urine: 1.029 (ref 1.005–1.030)
pH: 6.5 (ref 5.0–8.0)

## 2013-05-27 LAB — TSH: TSH: 1.452 u[IU]/mL (ref 0.350–4.500)

## 2013-05-27 LAB — PREGNANCY, URINE: Preg Test, Ur: NEGATIVE

## 2013-05-27 MED ORDER — LORAZEPAM 1 MG PO TABS
1.0000 mg | ORAL_TABLET | Freq: Once | ORAL | Status: AC
  Administered 2013-05-27: 1 mg via ORAL
  Filled 2013-05-27: qty 1

## 2013-05-27 MED ORDER — ONDANSETRON 4 MG PO TBDP
4.0000 mg | ORAL_TABLET | Freq: Once | ORAL | Status: AC
  Administered 2013-05-27: 4 mg via ORAL
  Filled 2013-05-27: qty 1

## 2013-05-27 MED ORDER — ONDANSETRON HCL 4 MG PO TABS: 4.0000 mg | ORAL_TABLET | Freq: Four times a day (QID) | ORAL | Status: DC

## 2013-05-27 MED ORDER — IOHEXOL 350 MG/ML SOLN
100.0000 mL | Freq: Once | INTRAVENOUS | Status: AC | PRN
  Administered 2013-05-27: 100 mL via INTRAVENOUS

## 2013-05-27 NOTE — ED Notes (Signed)
Pt here via ems with anxiety states no history of this before

## 2013-05-27 NOTE — ED Notes (Signed)
MD at bedside. 

## 2013-05-27 NOTE — ED Notes (Signed)
MD at bedside discussing test results and plan for CT.

## 2013-05-27 NOTE — ED Notes (Signed)
Patient transported to CT 

## 2013-05-27 NOTE — ED Provider Notes (Signed)
History  This chart was scribed for Glynn Octave, MD by Ardelia Mems, ED Scribe. This patient was seen in room MH10/MH10 and the patient's care was started at 3:27 PM.  CSN: [id number]  Arrival date & time 05/27/13  1522   Chief Complaint  Patient presents with  . Anxiety    The history is provided by the patient. No language interpreter was used.   HPI Comments: Joan Harris is a 27 y.o. female brought by EMS to the Emergency Department complaining of a suspected anxiety attack onset prior to arrival. Pt denies any hx of anxiety attacks. 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 Pt states that she all she has eaten today is a small amount of fruit this morning. Pt states that she took 3 of her daily medications, including her birth control, at lunch today, and reports dizziness and nausea about 2 hours after this. Pt also reports a gradual onset of associated symptoms including SOB, hyperventilation and increased effort with breathing. Pt states that her supervisor called EMS. Pt states that she has a mild headache, weakness and elevated chronic back pain. Pt states that she also has abdominal cramping, which she attributes to her current menstrual cycle. Pt denies hx of asthma, diabetes, HTN, heart disease and states that she has no other medical problems. Pt denies diarrhea, chest pain, rash or any other symptoms. Pt is a former smoker.  PCP Dr. Birdena Jubilee   Past Medical History  Diagnosis Date  . Back pain   . Obesity   . Persistent headaches    Past Surgical History  Procedure Laterality Date  . Bartholin gland cyst excision     Family History  Problem Relation Age of Onset  . Heart disease Mother   . Hypertension Mother   . Hyperlipidemia Mother   . Hypertension Sister   . Asthma Daughter   . Cancer Maternal Grandmother    History  Substance Use Topics  . Smoking  status: Former Games developer  . Smokeless tobacco: Not on file  . Alcohol Use: No   OB History   Grav Para Term Preterm Abortions TAB SAB Ect Mult Living                 Review of Systems  Constitutional: Negative for chills and fatigue.  HENT: Negative for nosebleeds, congestion and sore throat.   Eyes: Negative for visual disturbance.  Respiratory: Positive for shortness of breath. Negative for cough.   Cardiovascular: Negative for chest pain.  Gastrointestinal: Positive for nausea. Negative for vomiting, diarrhea and constipation.  Genitourinary: Negative for dysuria and hematuria.       Menstrual cramping currently.  Musculoskeletal: Positive for back pain.  Skin: Negative for rash.  Neurological: Positive for dizziness, weakness and headaches. Negative for syncope.  Psychiatric/Behavioral: The patient is nervous/anxious.   A complete 10 system review of systems was obtained and all systems are negative except as noted in the HPI and PMH.   Allergies  Review of patient's allergies indicates no known allergies.  Home Medications   Current Outpatient Rx  Name  Route  Sig  Dispense  Refill  . meloxicam (MOBIC) 7.5 MG tablet   Oral   Take 1 tablet (7.5 mg total) by mouth daily.   90 tablet   1   . ondansetron (ZOFRAN) 4 MG tablet   Oral   Take 1 tablet (4  mg total) by mouth every 6 (six) hours.   12 tablet   0   . pantoprazole (PROTONIX) 40 MG tablet   Oral   Take 1 tablet (40 mg total) by mouth daily.   180 tablet   1   . Phentermine-Topiramate (QSYMIA) 3.75-23 MG CP24   Oral   Take 1 capsule by mouth daily.   14 capsule   0   . Phentermine-Topiramate (QSYMIA) 7.5-46 MG CP24   Oral   Take 1 capsule by mouth daily.   30 capsule   2   . tiZANidine (ZANAFLEX) 4 MG tablet   Oral   Take 1 tablet (4 mg total) by mouth every 8 (eight) hours as needed.   60 tablet   5   . ZENCHENT FE 0.4-35 MG-MCG tablet                Triage Vitals: BP 137/68  Pulse 64   Temp(Src) 98 F (36.7 C) (Oral)  Resp 16  Ht 5\' 9"  (1.753 m)  Wt 292 lb (132.45 kg)  BMI 43.1 kg/m2  SpO2 100%  LMP 05/26/2013  Physical Exam  Nursing note and vitals reviewed. Constitutional: She is oriented to person, place, and time. She appears well-developed and well-nourished. No distress.  Appears anxious.   HENT:  Head: Normocephalic and atraumatic.  Eyes: EOM are normal.  Neck: Neck supple. No tracheal deviation present.  Cardiovascular: Normal rate.   Pulmonary/Chest: Effort normal. No respiratory distress.  Hyperventilating.  Musculoskeletal: Normal range of motion.  Neurological: She is alert and oriented to person, place, and time.  Poor effort with neuro exam.  Skin: Skin is warm and dry.  Psychiatric: She has a normal mood and affect. Her behavior is normal.    ED Course  Procedures (including critical care time)  DIAGNOSTIC STUDIES: Oxygen Saturation is 100% on RA, normal by my interpretation.    COORDINATION OF CARE: 3:35 PM- Pt advised of plan to treat her nausea and anxiety, along with diagnostic lab work and radiology and pt agrees.  Medications  LORazepam (ATIVAN) tablet 1 mg (1 mg Oral Given 05/27/13 1546)  ondansetron (ZOFRAN-ODT) disintegrating tablet 4 mg (4 mg Oral Given 05/27/13 1547)  iohexol (OMNIPAQUE) 350 MG/ML injection 100 mL (100 mLs Intravenous Contrast Given 05/27/13 1638)    Labs Reviewed  URINALYSIS, ROUTINE W REFLEX MICROSCOPIC - Abnormal; Notable for the following:    Color, Urine AMBER (*)    Hgb urine dipstick MODERATE (*)    Ketones, ur 15 (*)    All other components within normal limits  CBC WITH DIFFERENTIAL - Abnormal; Notable for the following:    Platelets 413 (*)    Lymphs Abs 4.3 (*)    All other components within normal limits  COMPREHENSIVE METABOLIC PANEL - Abnormal; Notable for the following:    Potassium 3.3 (*)    Total Bilirubin 0.2 (*)    GFR calc non Af Amer 86 (*)    All other components within normal  limits  D-DIMER, QUANTITATIVE - Abnormal; Notable for the following:    D-Dimer, Quant 1.38 (*)    All other components within normal limits  PREGNANCY, URINE  TROPONIN I  URINE MICROSCOPIC-ADD ON   Ct Angio Chest Pe W/cm &/or Wo Cm  05/27/2013   *RADIOLOGY REPORT*  Clinical Data: Shortness of breath, elevated D-dimer  CT ANGIOGRAPHY CHEST  Technique:  Multidetector CT imaging of the chest using the standard protocol during bolus administration of intravenous contrast. Multiplanar  reconstructed images including MIPs were obtained and reviewed to evaluate the vascular anatomy.  Contrast: OMNIPAQUE IOHEXOL 350 MG/ML SOLN  Comparison: None  Findings: Aorta normal caliber without aneurysm or dissection. Residual thymic tissue anterior mediastinum. Visualized portion of liver and spleen normal appearance. No thoracic adenopathy. Pulmonary arteries patent. No evidence of pulmonary embolism. Lungs clear. No infiltrate, pleural effusion or pneumothorax. Osseous structures unremarkable.  IMPRESSION: Negative CTA chest.   Original Report Authenticated By: Ulyses Southward, M.D.    1. Nausea     MDM  Presents via EMS with shortness of breath, nausea, anxiety at work. Denies chest pain. Fasted this morning for blood work.  Labs remarkable for mild hypokalemia. EKG normal sinus rhythm. Troponin negative, d-dimer elevated. CT PE negative.  Patient states she is feeling better. She is now giving a normal effort on neuro exam and has a normal exam. She is tolerating by mouth and ambulatory.  Suspect symptoms related to possible hypovolemia or hypoglycemia, likely anxiety component as well.   Date: 05/27/2013  Rate: 63  Rhythm: normal sinus rhythm  QRS Axis: normal  Intervals: normal  ST/T Wave abnormalities: normal  Conduction Disutrbances:none  Narrative Interpretation:   Old EKG Reviewed: none available        I personally performed the services described in this documentation, which was  scribed in my presence. The recorded information has been reviewed and is accurate.    Glynn Octave, MD 05/27/13 1714

## 2013-05-29 LAB — INSULIN, FASTING: Insulin fasting, serum: 45 u[IU]/mL — ABNORMAL HIGH (ref 3–28)

## 2013-06-26 ENCOUNTER — Encounter: Payer: Self-pay | Admitting: Family Medicine

## 2013-06-26 DIAGNOSIS — L83 Acanthosis nigricans: Secondary | ICD-10-CM | POA: Insufficient documentation

## 2013-06-26 DIAGNOSIS — Z6835 Body mass index (BMI) 35.0-35.9, adult: Secondary | ICD-10-CM | POA: Insufficient documentation

## 2013-06-26 DIAGNOSIS — R5383 Other fatigue: Secondary | ICD-10-CM | POA: Insufficient documentation

## 2013-06-26 DIAGNOSIS — K219 Gastro-esophageal reflux disease without esophagitis: Secondary | ICD-10-CM | POA: Insufficient documentation

## 2013-06-26 DIAGNOSIS — M549 Dorsalgia, unspecified: Secondary | ICD-10-CM | POA: Insufficient documentation

## 2013-06-26 DIAGNOSIS — R5381 Other malaise: Secondary | ICD-10-CM | POA: Insufficient documentation

## 2013-06-26 NOTE — Assessment & Plan Note (Signed)
I am concerned about Joan Harris's insulin resistance given the acanthosis nigricans around her neck.  She is to return one morning for [place] to check a fasting sugar and her insulin level.

## 2013-06-26 NOTE — Assessment & Plan Note (Signed)
She never did get this filled at [facility].  I reminded her of this.

## 2013-06-26 NOTE — Assessment & Plan Note (Signed)
Checking her thyroid function.   

## 2013-06-26 NOTE — Assessment & Plan Note (Signed)
This is an ongoing problem for Joan Harris.  She knows that her weight is a big issue for her back and next pain.  She was evaluated by a plastic surgeon but did not follow through with the surgery.  I had a long talk with her about what she needs to do to get herself better.  She is limited in what she can do because of time and finances.  Her boyfriend seems to be supportive and appears to want to help her get better.

## 2013-06-26 NOTE — Patient Instructions (Addendum)
Insulin Resistance Blood sugar (glucose) levels are controlled by a hormone called insulin. Insulin is made by your pancreas. When your blood glucose goes up, insulin is released into your blood. Insulin is required for your body to function normally. However, your body can become resistant to your own insulin or to insulin given to treat diabetes. In either case, insulin resistance can lead to serious problems. These problems include:  Type 2 diabetes.  Heart disease.  High blood pressure.  Stroke.  Polycystic ovary syndrome.  Fatty liver. CAUSES  Insulin resistance can develop for many different reasons. It is more likely to happen in people with these conditions or characteristics:  Obesity.  Inactivity.  Pregnancy.  High blood pressure.  Stress.  Steroid use.  Infection or severe illness.  Increased levels of cholesterol and triglycerides. SYMPTOMS  There are no symptoms. You may have symptoms related to the various complications of insulin resistance.  DIAGNOSIS  Several different things can make your caregiver suspect you have insulin resistance. These include:  High blood glucose (hyperglycemia).  Abnormal cholesterol levels.  High uric acid levels.  Changes related to blood pressure.  Changes related to inflammation. Insulin resistance can be determined with blood tests. An elevated insulin level when you have not eaten might suggest resistance. Other more complicated tests are sometimes necessary. TREATMENT  Lifestyle changes are the most important treatment for insulin resistance.   If you are overweight and you have insulin resistance, you can improve your insulin sensitivity by losing weight.  Moderate exercise for 30 40 minutes, 4 days a week, can improve insulin sensitivity. Some medicines can also help improve your insulin sensitivity. Your caregiver can discuss these with you if they are appropriate.  HOME CARE INSTRUCTIONS   Do not  smoke.  Keep your weight at a healthy level.  Get exercise.  If you have diabetes, follow your caregiver's directions.  If you have high blood pressure, follow your caregiver's directions.  Only take prescription medicines for pain, fever, or discomfort as directed by your caregiver. SEEK MEDICAL CARE IF:   You are diabetic and you are having problems keeping your blood glucose levels at target range.  You are having episodes of low blood glucose (hypoglycemia).  You feel you might be having side effects from your medicines.  You have symptoms of an illness that is not improving after 3 4 days.  You have a sore or wound that is not healing.  You notice a change in vision or a new problem with your vision. SEEK IMMEDIATE MEDICAL CARE IF:   Your blood glucose goes below 70, especially if you have confusion, lightheadedness, or other symptoms with it.  Your blood glucose is very high (as advised by your caregiver) twice in a row.  You pass out.  You have chest pain or trouble breathing.  You have a sudden, severe headache.  You have sudden weakness in one arm or one leg.  You have sudden difficulty speaking or swallowing.  You develop vomiting or diarrhea that is getting worse or not improving after 1 day. Document Released: 01/03/2006 Document Revised: 05/15/2012 Document Reviewed: 01/01/2009 [facility] Patient Information 2014 [place], [place].  Acanthosis Nigricans Acanthosis nigricans (AN) is a disorder that may begin at any age, including birth. It causes velvety, light brown, black, or grayish markings on the skin. They are usually found on the:  Face.  Neck.  Armpits.  Inner thighs.  Groin. AN can be noncancerous (benign) or associated with cancer (malignant). Most  often, AN is a benign condition. Benign AN is primarily associated with being overweight. In young people, insulin resistance is the most common association with AN. Insulin is the hormone that  controls your blood sugar. Insulin resistance occurs when the body does not use its insulin properly. Benign AN may cause social problems, since the person may appear as if he or she has poor hygiene.  CAUSES  Some people are born with AN. It is sometimes caused by a hormonal or glandular disorder, such as diabetes. Eating too much of the wrong foods, especially starches and sugars, raises insulin levels. Most patients with AN have a high insulin level. Increased insulin activates insulin receptors in the skin and forces them to grow abnormally. This may help cause AN. Reducing insulin by a special diet can lead to a rapid improvement of the skin problem. Both sexes are affected equally. Rarely, AN is associated with a tumor. The type of AN associated with malignancy more often occurs in elderly people. However, cases have been reported in children with a rare kidney cancer called Wilms' tumor. Malignant AN affects all races equally. SYMPTOMS  AN usually does not cause symptoms. Most people who have AN are bothered primarily by its appearance. DIAGNOSIS  When AN develops in people who are not overweight, medical tests are often done to find the cause. When AN is associated with malignancy, it is unusually severe. In those cases, AN can be seen in additional places, such as the lips or hands. AN associated with malignancy is linked to major problems because it is caused by the presence of a cancer. The tumor is often aggressive and destructive. Benign AN has a good outcome. It is easily treated with good results. TREATMENT   Treatment to improve the appearance of AN includes prescription medicines (retinoids, 20% urea, alpha hydroxy acids, salicylic acid).  If overweight, avoiding starchy foods and sugars that raise the insulin level can help. Losing weight will also help decrease the appearance of AN tremendously.  Oral medicines are available that help decrease high insulin. HOME CARE INSTRUCTIONS    If you are overweight, exercise and watch your diet to lose the extra weight.  Use medicines prescribed by your caregiver as instructed. SEEK MEDICAL CARE IF:  You develop an unexplained case of AN in adulthood. Document Released: 11/14/2005 Document Revised: 02/06/2012 Document Reviewed: 03/04/2010 [facility] Patient Information 2014 [place], [place].

## 2013-06-26 NOTE — Assessment & Plan Note (Signed)
She was given prescriptions for Qsymia to try.

## 2014-07-22 ENCOUNTER — Ambulatory Visit (INDEPENDENT_AMBULATORY_CARE_PROVIDER_SITE_OTHER): Payer: BC Managed Care – PPO | Admitting: Diagnostic Neuroimaging

## 2014-07-22 ENCOUNTER — Encounter: Payer: Self-pay | Admitting: Diagnostic Neuroimaging

## 2014-07-22 VITALS — BP 121/78 | HR 70 | Temp 97.0°F | Ht 69.0 in | Wt 298.8 lb

## 2014-07-22 DIAGNOSIS — G43009 Migraine without aura, not intractable, without status migrainosus: Secondary | ICD-10-CM

## 2014-07-22 DIAGNOSIS — R51 Headache: Secondary | ICD-10-CM

## 2014-07-22 MED ORDER — RIZATRIPTAN BENZOATE 10 MG PO TBDP: 10.0000 mg | ORAL_TABLET | ORAL | Status: DC | PRN

## 2014-07-22 MED ORDER — TOPIRAMATE 50 MG PO TABS: 50.0000 mg | ORAL_TABLET | Freq: Two times a day (BID) | ORAL | Status: DC

## 2014-07-22 NOTE — Progress Notes (Signed)
[facility]  PATIENT: Joan Harris DOB: [birth year]  REFERRING CLINICIAN: Schlanger HISTORY FROM: patient and SO REASON FOR VISIT: new consult   HISTORICAL  CHIEF COMPLAINT:  Chief Complaint  Patient presents with  . Headache    HISTORY OF PRESENT ILLNESS:   28 year old right-handed female here for evaluation of headaches.  For past 1 year (2014-2015) patient has had intermittent mild headaches with some nausea and photophobia. Over the past one week she has had more significant daily headaches, 4/5 in severity, with daily exacerbations up to 9/10. Headaches are bitemporal, retro-orbital, with nausea, dizziness, photophobia and phonophobia. Patient has been taking ibuprofen 600 mg daily for the past 3 months. She also has tried Tylenol/aspirin/caffeine tablets. One week ago patient went to urgent care, was prescribed prednisone course, oxycodone/Tylenol, Phenergan without relief.  No specific triggering factors. No change in sleep, stress, diet or exercise. No medication changes in the past year. Patient's weight has steadily increased from 280 pounds up to 299 pounds over the past 5 years. No family history of migraine.   REVIEW OF SYSTEMS: Full 14 system review of systems performed and notable only for headache eye pain urination problems decreased energy.  ALLERGIES: No Known Allergies  HOME MEDICATIONS: Prior to Admission medications   Medication Sig Start Date End Date Taking? Authorizing Provider  ibuprofen (ADVIL,MOTRIN) 200 MG tablet Take 200 mg by mouth daily.   Yes Historical Provider, MD  phenazopyridine (PYRIDIUM) 200 MG tablet Take 1 tablet by mouth 3 (three) times daily as needed. 07/19/14  Yes Historical Provider, MD  predniSONE (DELTASONE) 10 MG tablet Take 3 tablets by mouth daily. X 7days 07/16/14  Yes Historical Provider, MD  promethazine (PHENERGAN) 25 MG tablet Take 1 tablet by mouth 4 (four) times daily as needed. 07/16/14  Yes  Historical Provider, MD  cetirizine (ZYRTEC) 10 MG tablet Take 1 tablet by mouth daily. 01/16/13   Historical Provider, MD    PAST MEDICAL HISTORY: Past Medical History  Diagnosis Date  . Back pain   . Obesity   . Persistent headaches     PAST SURGICAL HISTORY: Past Surgical History  Procedure Laterality Date  . Bartholin gland cyst excision      FAMILY HISTORY: Family History  Problem Relation Age of Onset  . Heart disease Mother   . Hypertension Mother   . Hyperlipidemia Mother   . Hypertension Sister   . Asthma Daughter   . Cancer Maternal Grandmother   . Breast cancer Maternal Grandmother     SOCIAL HISTORY:  History   Social History  . Marital Status: Single    Spouse Name: N/A    Number of Children: 1  . Years of Education: College   Occupational History  .      [employer]   Social History Main Topics  . Smoking status: Former Smoker    Types: Cigars  . Smokeless tobacco: Never Used  . Alcohol Use: No  . Drug Use: No  . Sexual Activity: Yes    Birth Control/ Protection: Pill   Other Topics Concern  . Not on file   Social History Narrative   Marital Status: Single   Children:  Daughter Teacher, early years/pre)   Pets: None   Living Situation: Lives with her daughter   Occupation: Clinical biochemist Geographical information systems officer)   Education: Fish farm manager  Nurse, adult)    Tobacco Use:  She used to smoke Black & Mild 4 x per week. She quit 3 years ago.  Alcohol Use:  None   Drug Use:  None   Diet:  Regular   Exercise:  None   Hobbies: Makeup, Traveling    Caffeine Use: 1-2 cups daily     PHYSICAL EXAM  Filed Vitals:   07/22/14 0827  BP: 121/78  Pulse: 70  Temp: 97 F (36.1 C)  TempSrc: Oral  Height:  (1.753 m)  Weight: 298 lb 12.8 oz (135.535 kg)    Not recorded    Body mass index is 44.1 kg/(m^2).  GENERAL EXAM: Patient is in no distress; well developed, nourished and groomed; neck is supple  CARDIOVASCULAR: Regular rate  and rhythm, no murmurs, no carotid bruits  NEUROLOGIC: MENTAL STATUS: awake, alert, oriented to person, place and time, recent and remote memory intact, normal attention and concentration, language fluent, comprehension intact, naming intact, fund of knowledge appropriate CRANIAL NERVE: no papilledema on fundoscopic exam, pupils equal and reactive to light, visual fields full to confrontation, extraocular muscles intact, no nystagmus, facial sensation and strength symmetric, hearing intact, palate elevates symmetrically, uvula midline, shoulder shrug symmetric, tongue midline. MOTOR: normal bulk and tone, full strength in the BUE, BLE SENSORY: normal and symmetric to light touch, pinprick, temperature, vibration  COORDINATION: finger-nose-finger, fine finger movements normal REFLEXES: deep tendon reflexes present and symmetric GAIT/STATION: narrow based gait; able to walk on toes, heels and tandem; romberg is negative    DIAGNOSTIC DATA (LABS, IMAGING, TESTING) - I reviewed patient records, labs, notes, testing and imaging myself where available.  Lab Results  Component Value Date   WBC 9.9 05/27/2013   HGB 12.4 05/27/2013   HCT 36.8 05/27/2013   MCV 88.2 05/27/2013   PLT 413* 05/27/2013      Component Value Date/Time   NA 139 05/27/2013 1552   K 3.3* 05/27/2013 1552   CL 102 05/27/2013 1552   CO2 25 05/27/2013 1552   GLUCOSE 99 05/27/2013 1552   BUN 11 05/27/2013 1552   CREATININE 0.90 05/27/2013 1552   CREATININE 0.88 05/27/2013 0943   CALCIUM 10.1 05/27/2013 1552   PROT 8.1 05/27/2013 1552   ALBUMIN 3.6 05/27/2013 1552   AST 19 05/27/2013 1552   ALT 15 05/27/2013 1552   ALKPHOS 64 05/27/2013 1552   BILITOT 0.2* 05/27/2013 1552   GFRNONAA 86* 05/27/2013 1552   GFRNONAA >89 05/27/2013 0943   GFRAA >90 05/27/2013 1552   GFRAA >89 05/27/2013 0943   No results found for this basename: CHOL, HDL, LDLCALC, LDLDIRECT, TRIG, CHOLHDL   No results found for this basename: HGBA1C   No results found  for this basename: VITAMINB12   Lab Results  Component Value Date   TSH 1.452 05/27/2013    I reviewed images myself and agree with interpretation. -[facility]  12/16/10 CT head - normal   ASSESSMENT AND PLAN  28 y.o. year old female here with 6-12 months of headaches with migraine features, and now with 1 week severe daily  Headaches. Risk factors include significant obesity.   Ddx: migraine, medication overuse headache, pseudotumor cerebri, other secondary headache  PLAN: - MRI, labs - TPX + rizatriptan - headache diary - eye doctor exam  Orders Placed This Encounter  Procedures  . MR Brain Wo Contrast  . CBC With differential/Platelet  . Comprehensive metabolic panel  . Hemoglobin A1c  . TSH   Meds ordered this encounter  Medications  . topiramate (TOPAMAX) 50 MG tablet    Sig: Take 1 tablet (50 mg total) by mouth 2 (two) times daily.  Dispense:  60 tablet    Refill:  6  . rizatriptan (MAXALT-MLT) 10 MG disintegrating tablet    Sig: Take 1 tablet (10 mg total) by mouth as needed for migraine. May repeat in 2 hours if needed    Dispense:  9 tablet    Refill:  6   Return in about 6 weeks (around 09/02/2014) for with Heide Guile or Russ Looper.    Suanne Marker, MD 07/22/2014, 9:05 AM Certified in Neurology, Neurophysiology and Neuroimaging  [facility] [address], [address] [place], [place] [address] [phone]

## 2014-07-22 NOTE — Patient Instructions (Signed)
1. Start topiramate  at bedtime x 1 week; then increase to twice a day. 2. Use rizatriptan  as needed for breakthrough severe headaches/migraine. 3. Start headache diary. 4. Follow up with eye doctor to have exam looking for swelling in the eyes ("papilledema"). 5. I will check MRI and lab testing.   ###############################################################################  Rizatriptan disintegrating tablets What is this medicine? RIZATRIPTAN (rye za TRIP tan) is used to treat migraines with or without aura. An aura is a strange feeling or visual disturbance that warns you of an attack. It is not used to prevent migraines. This medicine may be used for other purposes; ask your health care provider or pharmacist if you have questions. COMMON BRAND NAME(S): Maxalt-MLT What should I tell my health care provider before I take this medicine? They need to know if you have any of these conditions: -bowel disease or colitis -diabetes -family history of heart disease -fast or irregular heart beat -heart or blood vessel disease, angina (chest pain), or previous heart attack -high blood pressure -high cholesterol -history of stroke, transient ischemic attacks (TIAs or mini-strokes), or intracranial bleeding -kidney or liver disease -overweight -poor circulation -postmenopausal or surgical removal of uterus and ovaries -an unusual or allergic reaction to rizatriptan, other medicines, foods, dyes, or preservatives -pregnant or trying to get pregnant -breast-feeding How should I use this medicine? Take this medicine by mouth. Follow the directions on the prescription label. This medicine is taken at the first symptoms of a migraine. It is not for everyday use. Leave the tablet in the foil package until you are ready to take it. Do not push the tablet through the blister pack. Peel open the blister pack with dry hands and place the tablet on your tongue. The tablet will dissolve  rapidly and be swallowed in your saliva. It is not necessary to drink any water to take this medicine. If your migraine headache returns after one dose, you can take another dose as directed. You must leave at least 2 hours between doses, and do not take more than 30 mg total in 24 hours. If there is no improvement at all after the first dose, do not take a second dose without talking to your doctor or health care professional. Do not take your medicine more often than directed. Talk to your pediatrician regarding the use of this medicine in children. While this drug may be prescribed for children as young as 6 years for selected conditions, precautions do apply. Overdosage: If you think you have taken too much of this medicine contact a poison control center or emergency room at once. NOTE: This medicine is only for you. Do not share this medicine with others. What if I miss a dose? This does not apply; this medicine is not for regular use. What may interact with this medicine? Do not take this medicine with any of the following medicines: -amphetamine, dextroamphetamine or cocaine -dihydroergotamine, ergotamine, ergoloid mesylates, methysergide, or ergot-type medication - do not take within 24 hours of taking rizatriptan -feverfew -MAOIs like Carbex, Eldepryl, Marplan, Nardil, and Parnate - do not take rizatriptan within 2 weeks of stopping MAOI therapy. -other migraine medicines like almotriptan, eletriptan, naratriptan, sumatriptan, zolmitriptan - do not take within 24 hours of taking rizatriptan -tryptophan This medicine may also interact with the following medications: -medicines for mental depression, anxiety or mood problems -propranolol This list may not describe all possible interactions. Give your health care provider a list of all the medicines, herbs, non-prescription drugs, or dietary  supplements you use. Also tell them if you smoke, drink alcohol, or use illegal drugs. Some items may  interact with your medicine. What should I watch for while using this medicine? Only take this medicine for a migraine headache. Take it if you get warning symptoms or at the start of a migraine attack. It is not for regular use to prevent migraine attacks. You may get drowsy or dizzy. Do not drive, use machinery, or do anything that needs mental alertness until you know how this medicine affects you. To reduce dizzy or fainting spells, do not sit or stand up quickly, especially if you are an older patient. Alcohol can increase drowsiness, dizziness and flushing. Avoid alcoholic drinks. Smoking cigarettes may increase the risk of heart-related side effects from using this medicine. If you take migraine medicines for 10 or more days a month, your migraines may get worse. Keep a diary of headache days and medicine use. Contact your healthcare professional if your migraine attacks occur more frequently. What side effects may I notice from receiving this medicine? Side effects that you should report to your doctor or health care professional as soon as possible: -allergic reactions like skin rash, itching or hives, swelling of the face, lips, or tongue -fast, slow, or irregular heart beat -increased or decreased blood pressure -seizures -severe stomach pain and cramping, bloody diarrhea -signs and symptoms of a blood clot such as breathing problems; changes in vision; chest pain; severe, sudden headache; pain, swelling, warmth in the leg; trouble speaking; sudden numbness or weakness of the face, arm or leg -tingling, pain, or numbness in the face, hands, or feet Side effects that usually do not require medical attention (report to your doctor or health care professional if they continue or are bothersome): -drowsiness -dry mouth -feeling warm, flushing, or redness of the face -headache -muscle cramps, pain -nausea, vomiting -unusually weak or tired This list may not describe all possible side  effects. Call your doctor for medical advice about side effects. You may report side effects to FDA at 1-800-FDA-1088. Where should I keep my medicine? Keep out of the reach of children. Store at room temperature between 15 and 30 degrees C (59 and 86 degrees F). Protect from light and moisture. Throw away any unused medicine after the expiration date. NOTE: This sheet is a summary. It may not cover all possible information. If you have questions about this medicine, talk to your doctor, pharmacist, or health care provider.  2015, Elsevier/Gold Standard. (2013-07-16 10:17:42)   Topiramate tablets What is this medicine? TOPIRAMATE (toe PYRE a mate) is used to treat seizures in adults or children with epilepsy. It is also used for the prevention of migraine headaches. This medicine may be used for other purposes; ask your health care provider or pharmacist if you have questions. COMMON BRAND NAME(S): Topamax, Topiragen What should I tell my health care provider before I take this medicine? They need to know if you have any of these conditions: -bleeding disorders -cirrhosis of the liver or liver disease -diarrhea -glaucoma -kidney stones or kidney disease -low blood counts, like low white cell, platelet, or red cell counts -lung disease like asthma, obstructive pulmonary disease, emphysema -metabolic acidosis -on a ketogenic diet -schedule for surgery or a procedure -suicidal thoughts, plans, or attempt; a previous suicide attempt by you or a family member -an unusual or allergic reaction to topiramate, other medicines, foods, dyes, or preservatives -pregnant or trying to get pregnant -breast-feeding How should I use this medicine?  Take this medicine by mouth with a glass of water. Follow the directions on the prescription label. Do not crush or chew. You may take this medicine with meals. Take your medicine at regular intervals. Do not take it more often than directed. Talk to your  pediatrician regarding the use of this medicine in children. Special care may be needed. While this drug may be prescribed for children as young as 76 years of age for selected conditions, precautions do apply. Overdosage: If you think you have taken too much of this medicine contact a poison control center or emergency room at once. NOTE: This medicine is only for you. Do not share this medicine with others. What if I miss a dose? If you miss a dose, take it as soon as you can. If your next dose is to be taken in less than 6 hours, then do not take the missed dose. Take the next dose at your regular time. Do not take double or extra doses. What may interact with this medicine? Do not take this medicine with any of the following medications: -probenecid This medicine may also interact with the following medications: -acetazolamide -alcohol -amitriptyline -aspirin and aspirin-like medicines -birth control pills -certain medicines for depression -certain medicines for seizures -certain medicines that treat or prevent blood clots like warfarin, enoxaparin, dalteparin, apixaban, dabigatran, and rivaroxaban -digoxin -hydrochlorothiazide -lithium -medicines for pain, sleep, or muscle relaxation -metformin -methazolamide -NSAIDS, medicines for pain and inflammation, like ibuprofen or naproxen -pioglitazone -risperidone This list may not describe all possible interactions. Give your health care provider a list of all the medicines, herbs, non-prescription drugs, or dietary supplements you use. Also tell them if you smoke, drink alcohol, or use illegal drugs. Some items may interact with your medicine. What should I watch for while using this medicine? Visit your doctor or health care professional for regular checks on your progress. Do not stop taking this medicine suddenly. This increases the risk of seizures if you are using this medicine to control epilepsy. Wear a medical identification  bracelet or chain to say you have epilepsy or seizures, and carry a card that lists all your medicines. This medicine can decrease sweating and increase your body temperature. Watch for signs of deceased sweating or fever, especially in children. Avoid extreme heat, hot baths, and saunas. Be careful about exercising, especially in hot weather. Contact your health care provider right away if you notice a fever or decrease in sweating. You should drink plenty of fluids while taking this medicine. If you have had kidney stones in the past, this will help to reduce your chances of forming kidney stones. If you have stomach pain, with nausea or vomiting and yellowing of your eyes or skin, call your doctor immediately. You may get drowsy, dizzy, or have blurred vision. Do not drive, use machinery, or do anything that needs mental alertness until you know how this medicine affects you. To reduce dizziness, do not sit or stand up quickly, especially if you are an older patient. Alcohol can increase drowsiness and dizziness. Avoid alcoholic drinks. If you notice blurred vision, eye pain, or other eye problems, seek medical attention at once for an eye exam. The use of this medicine may increase the chance of suicidal thoughts or actions. Pay special attention to how you are responding while on this medicine. Any worsening of mood, or thoughts of suicide or dying should be reported to your health care professional right away. This medicine may increase the chance of  developing metabolic acidosis. If left untreated, this can cause kidney stones, bone disease, or slowed growth in children. Symptoms include breathing fast, fatigue, loss of appetite, irregular heartbeat, or loss of consciousness. Call your doctor immediately if you experience any of these side effects. Also, tell your doctor about any surgery you plan on having while taking this medicine since this may increase your risk for metabolic acidosis. Birth  control pills may not work properly while you are taking this medicine. Talk to your doctor about using an extra method of birth control. Women who become pregnant while using this medicine may enroll in the [place] American Antiepileptic Drug Pregnancy Registry by calling [phone]. This registry collects information about the safety of antiepileptic drug use during pregnancy. What side effects may I notice from receiving this medicine? Side effects that you should report to your doctor or health care professional as soon as possible: -allergic reactions like skin rash, itching or hives, swelling of the face, lips, or tongue -decreased sweating and/or rise in body temperature -depression -difficulty breathing, fast or irregular breathing patterns -difficulty speaking -difficulty walking or controlling muscle movements -hearing impairment -redness, blistering, peeling or loosening of the skin, including inside the mouth -tingling, pain or numbness in the hands or feet -unusual bleeding or bruising -unusually weak or tired -worsening of mood, thoughts or actions of suicide or dying Side effects that usually do not require medical attention (report to your doctor or health care professional if they continue or are bothersome): -altered taste -back pain, joint or muscle aches and pains -diarrhea, or constipation -headache -loss of appetite -nausea -stomach upset, indigestion -tremors This list may not describe all possible side effects. Call your doctor for medical advice about side effects. You may report side effects to FDA at 1-800-FDA-1088. Where should I keep my medicine? Keep out of the reach of children. Store at room temperature between 15 and 30 degrees C (59 and 86 degrees F) in a tightly closed container. Protect from moisture. Throw away any unused medicine after the expiration date. NOTE: This sheet is a summary. It may not cover all possible information. If you have  questions about this medicine, talk to your doctor, pharmacist, or health care provider.  2015, Elsevier/Gold Standard. (2013-11-18 23:17:57)   Migraine Headache A migraine headache is very bad, throbbing pain on one or both sides of your head. Talk to your doctor about what things may bring on (trigger) your migraine headaches. HOME CARE  Only take medicines as told by your doctor.  Lie down in a dark, quiet room when you have a migraine.  Keep a journal to find out if certain things bring on migraine headaches. For example, write down:  What you eat and drink.  How much sleep you get.  Any change to your diet or medicines.  Lessen how much alcohol you drink.  Quit smoking if you smoke.  Get enough sleep.  Lessen any stress in your life.  Keep lights dim if bright lights bother you or make your migraines worse. GET HELP RIGHT AWAY IF:   Your migraine becomes really bad.  You have a fever.  You have a stiff neck.  You have trouble seeing.  Your muscles are weak, or you lose muscle control.  You lose your balance or have trouble walking.  You feel like you will pass out (faint), or you pass out.  You have really bad symptoms that are different than your first symptoms. MAKE SURE YOU:   Understand  these instructions.  Will watch your condition.  Will get help right away if you are not doing well or get worse. Document Released: 08/23/2008 Document Revised: 02/06/2012 Document Reviewed: 07/22/2013 [facility] Patient Information 2015 [place], [place]. This information is not intended to replace advice given to you by your health care provider. Make sure you discuss any questions you have with your health care provider.

## 2014-07-23 LAB — COMPREHENSIVE METABOLIC PANEL
ALK PHOS: 66 IU/L (ref 39–117)
ALT: 15 IU/L (ref 0–32)
AST: 15 IU/L (ref 0–40)
Albumin/Globulin Ratio: 1.3 (ref 1.1–2.5)
Albumin: 4 g/dL (ref 3.5–5.5)
BUN / CREAT RATIO: 15 (ref 8–20)
BUN: 13 mg/dL (ref 6–20)
CO2: 26 mmol/L (ref 18–29)
CREATININE: 0.86 mg/dL (ref 0.57–1.00)
Calcium: 8.8 mg/dL (ref 8.7–10.2)
Chloride: 102 mmol/L (ref 97–108)
GFR calc Af Amer: 106 mL/min/{1.73_m2} (ref >59)
GFR calc non Af Amer: 92 mL/min/{1.73_m2} (ref >59)
GLOBULIN, TOTAL: 3.2 g/dL (ref 1.5–4.5)
Glucose: 73 mg/dL (ref 65–99)
Potassium: 4 mmol/L (ref 3.5–5.2)
SODIUM: 142 mmol/L (ref 134–144)
Total Bilirubin: 0.3 mg/dL (ref 0.0–1.2)
Total Protein: 7.2 g/dL (ref 6.0–8.5)

## 2014-07-23 LAB — HEMOGLOBIN A1C
ESTIMATED AVERAGE GLUCOSE: 126 mg/dL
Hgb A1c MFr Bld: 6 % — ABNORMAL HIGH (ref 4.8–5.6)

## 2014-07-23 LAB — CBC WITH DIFFERENTIAL
BASOS ABS: 0 10*3/uL (ref 0.0–0.2)
Basos: 0 %
EOS ABS: 0.1 10*3/uL (ref 0.0–0.4)
EOS: 1 %
HCT: 37.8 % (ref 34.0–46.6)
Hemoglobin: 12.5 g/dL (ref 11.1–15.9)
IMMATURE GRANULOCYTES: 0 %
Immature Grans (Abs): 0 10*3/uL (ref 0.0–0.1)
LYMPHS ABS: 5.7 10*3/uL — AB (ref 0.7–3.1)
Lymphs: 58 %
MCH: 28.9 pg (ref 26.6–33.0)
MCHC: 33.1 g/dL (ref 31.5–35.7)
MCV: 88 fL (ref 79–97)
MONOS ABS: 0.7 10*3/uL (ref 0.1–0.9)
Monocytes: 7 %
Neutrophils Absolute: 3.3 10*3/uL (ref 1.4–7.0)
Neutrophils Relative %: 34 %
PLATELETS: 375 10*3/uL (ref 150–379)
RBC: 4.32 x10E6/uL (ref 3.77–5.28)
RDW: 14.7 % (ref 12.3–15.4)
WBC: 9.8 10*3/uL (ref 3.4–10.8)

## 2014-07-23 LAB — TSH: TSH: 2.55 u[IU]/mL (ref 0.450–4.500)

## 2014-09-02 ENCOUNTER — Telehealth: Payer: Self-pay | Admitting: *Deleted

## 2014-09-02 ENCOUNTER — Ambulatory Visit: Payer: BC Managed Care – PPO | Admitting: Nurse Practitioner

## 2014-09-02 NOTE — Telephone Encounter (Signed)
Patient calling in to cancel appointment with LYNN this morning due to a sick child, patient was r/s on the [address] with Larita FifeLynn.

## 2014-09-17 ENCOUNTER — Ambulatory Visit: Payer: Self-pay | Admitting: Nurse Practitioner

## 2014-10-01 ENCOUNTER — Encounter: Payer: Self-pay | Admitting: Nurse Practitioner

## 2014-10-01 ENCOUNTER — Ambulatory Visit (INDEPENDENT_AMBULATORY_CARE_PROVIDER_SITE_OTHER): Payer: BC Managed Care – PPO | Admitting: Nurse Practitioner

## 2014-10-01 VITALS — BP 115/76 | HR 69 | Temp 98.2°F | Ht 69.0 in | Wt 296.0 lb

## 2014-10-01 DIAGNOSIS — G43009 Migraine without aura, not intractable, without status migrainosus: Secondary | ICD-10-CM

## 2014-10-01 MED ORDER — TOPIRAMATE 50 MG PO TABS: 100.0000 mg | ORAL_TABLET | Freq: Every day | ORAL | Status: DC

## 2014-10-01 MED ORDER — RIZATRIPTAN BENZOATE 10 MG PO TBDP: 10.0000 mg | ORAL_TABLET | ORAL | Status: DC | PRN

## 2014-10-01 NOTE — Patient Instructions (Signed)
Try to continue with taking Topirimate, take at bedtime.  After 4 weeks of taking 1 tablet every night, you may try to increase to 2 tablets at night.  Side effects of tingling or numbness in fingers, around the mouth, usually wear off after being on the medication a few weeks.  Continue Rizatriptan or Ibuprofen for acute headache as needed.  Use Rizatriptan for more severe headache.  Please wear your glasses as instructed by your eye doctor.  Please wear sunglasses in the sun.  You will be called to schedule the MRI of hte brain within a week, if no one calls, please call our office to let us know.  Follow up in 3 months, sooner as needed.

## 2014-10-01 NOTE — Progress Notes (Signed)
PATIENT: Joan Harris DOB: 05/04/1986  REASON FOR VISIT: routine follow up for headaches HISTORY FROM: patient  HISTORY OF PRESENT ILLNESS: 28 year old right-handed female here for evaluation of headaches.  Update 11/41/15 (LL): Since last visit patient states that her headaches have not been as bad. She had an eye evaluation and was told that there was no papilledema.  She was prescribed glasses with a blue tint to wear while she is at work on the computer. She thinks these have helped. She has not used Rizatriptan yet. She has been taking Topirimate 50 mg but only before bed, because she was very drowsy when she took it in the morning. She has a lightheaded "tingly" feeling in her head when taking it, I do not get the feeling that she is taking it consistently.  She states that no one ever called her to schedule the MRI.  For past 1 year (2014-2015) patient has had intermittent mild headaches with some nausea and photophobia. Over the past one week she has had more significant daily headaches, 4/5 in severity, with daily exacerbations up to 9/10. Headaches are bitemporal, retro-orbital, with nausea, dizziness, photophobia and phonophobia. Patient has been taking ibuprofen 600 mg daily for the past 3 months. She also has tried Tylenol/aspirin/caffeine tablets. One week ago patient went to urgent care, was prescribed prednisone course, oxycodone/Tylenol, Phenergan without relief.  No specific triggering factors. No change in sleep, stress, diet or exercise. No medication changes in the past year. Patient's weight has steadily increased from 280 pounds up to 299 pounds over the past 5 years. No family history of migraine.   REVIEW OF SYSTEMS: Full 14 system review of systems performed and notable only for headache eye discharge, eye itching, light sensitivity, nausea, back pain, numbness in left arm when waking up, headache.   ALLERGIES: No Known Allergies  HOME  MEDICATIONS: Outpatient Prescriptions Prior to Visit  Medication Sig Dispense Refill  . ibuprofen (ADVIL,MOTRIN) 200 MG tablet Take 200 mg by mouth daily.    . rizatriptan (MAXALT-MLT) 10 MG disintegrating tablet Take 1 tablet (10 mg total) by mouth as needed for migraine. May repeat in 2 hours if needed 9 tablet 6  . topiramate (TOPAMAX) 50 MG tablet Take 1 tablet (50 mg total) by mouth 2 (two) times daily. 60 tablet 6  . cetirizine (ZYRTEC) 10 MG tablet Take 1 tablet by mouth daily.    . phenazopyridine (PYRIDIUM) 200 MG tablet Take 1 tablet by mouth 3 (three) times daily as needed.    . predniSONE (DELTASONE) 10 MG tablet Take 3 tablets by mouth daily. X 7days    . promethazine (PHENERGAN) 25 MG tablet Take 1 tablet by mouth 4 (four) times daily as needed.     No facility-administered medications prior to visit.    PHYSICAL EXAM Filed Vitals:   10/01/14 0822  BP: 115/76  Pulse: 69  Temp: 98.2 F (36.8 C)  TempSrc: Oral  Height: 5\' 9"  (1.753 m)  Weight: 296 lb (134.265 kg)   Body mass index is 43.69 kg/(m^2).  Generalized: Well developed, in no acute distress, obese AA female  Head: normocephalic and atraumatic. Oropharynx benign  Neck: Supple, no carotid bruits  Cardiac: Regular rate rhythm, no murmur  Musculoskeletal: No deformity   Neurological examination  Mentation: Alert oriented to time, place, history taking. Follows all commands speech and language fluent Cranial nerve II-XII: no papilledema on fundoscopic exam. Pupils were equal round reactive to light extraocular movements were full,  visual field were full on confrontational test. Facial sensation and strength were normal. hearing was intact to finger rubbing bilaterally. Uvula tongue midline. head turning and shoulder shrug and were normal and symmetric.Tongue protrusion into cheek strength was normal. Motor: The motor testing reveals 5 over 5 strength of all 4 extremities. Good symmetric motor tone is noted  throughout.  Sensory: Sensory testing is intact to soft touch on all 4 extremities. No evidence of extinction is noted.  Coordination: Cerebellar testing reveals good finger-nose-finger and heel-to-shin bilaterally.  Gait and station: Gait is normal. Tandem gait is normal. Romberg is negative. Reflexes: Deep tendon reflexes are symmetric and normal bilaterally.   DIAGNOSTIC DATA (LABS, IMAGING, TESTING) - I reviewed patient records, labs, notes, testing and imaging myself where available.  Lab Results  Component Value Date   WBC 9.8 07/22/2014   HGB 12.5 07/22/2014   HCT 37.8 07/22/2014   MCV 88 07/22/2014   PLT 375 07/22/2014      Component Value Date/Time   NA 142 07/22/2014 0918   NA 139 05/27/2013 1552   K 4.0 07/22/2014 0918   CL 102 07/22/2014 0918   CO2 26 07/22/2014 0918   GLUCOSE 73 07/22/2014 0918   GLUCOSE 99 05/27/2013 1552   BUN 13 07/22/2014 0918   BUN 11 05/27/2013 1552   CREATININE 0.86 07/22/2014 0918   CREATININE 0.88 05/27/2013 0943   CALCIUM 8.8 07/22/2014 0918   PROT 7.2 07/22/2014 0918   PROT 8.1 05/27/2013 1552   ALBUMIN 3.6 05/27/2013 1552   AST 15 07/22/2014 0918   ALT 15 07/22/2014 0918   ALKPHOS 66 07/22/2014 0918   BILITOT 0.3 07/22/2014 0918   GFRNONAA 92 07/22/2014 0918   GFRNONAA >89 05/27/2013 0943   GFRAA 106 07/22/2014 0918   GFRAA >89 05/27/2013 0943   No results found for: CHOL, HDL, LDLCALC, LDLDIRECT, TRIG, CHOLHDL Lab Results  Component Value Date   HGBA1C 6.0* 07/22/2014   No results found for: [id number] Lab Results  Component Value Date   TSH 2.550 07/22/2014    ASSESSMENT: 28 year old female here with 6-12 months of headaches with migraine features, and now with 1 week severe daily headaches. Risk factors include significant obesity.   Ddx: migraine, medication overuse headache, pseudotumor cerebri, other secondary headache  PLAN: - MRI Brain Wo, instructed to call our office if no one calls her to schedule. -  TPX, Advised to take consistently daily at bedtime, after 4 weeks increase to 100 mg at bedtime. - rizatriptan for acute Migraine, Continue Ibuprofen for moderate headaches - Advised to wear Rx glasses whenever on computer.  Use sunglasses outside. - handout given for foods to avoid for Migraine. - headache diary - Follow up in 3 months, sooner as needed.  Orders Placed This Encounter  Procedures  . MR Brain Wo Contrast   Meds ordered this encounter  Medications  . topiramate (TOPAMAX) 50 MG tablet    Sig: Take 2 tablets (100 mg total) by mouth at bedtime.    Dispense:  60 tablet    Refill:  6    Order Specific Question:  Supervising Provider    Answer:  Joycelyn Schmid R [[id number]]  . rizatriptan (MAXALT-MLT) 10 MG disintegrating tablet    Sig: Take 1 tablet (10 mg total) by mouth as needed for migraine. May repeat in 2 hours if needed    Dispense:  9 tablet    Refill:  6    Order Specific Question:  Supervising Provider  AnswerSuanne Marker:  PENUMALLI, VIKRAM R [[id number]]   Return in about 3 months (around 01/01/2015) for Migraine.  Tawny AsalLYNN E. Oronde Hallenbeck, MSN, FNP-BC, A/GNP-C 10/01/2014, 10:16 AM [facility] [address], [address] [place], [place] [address] [phone]  Note: This document was prepared with digital dictation and possible smart phrase technology. Any transcriptional errors that result from this process are unintentional.

## 2014-10-09 ENCOUNTER — Other Ambulatory Visit: Payer: Managed Care, Other (non HMO)

## 2014-10-20 ENCOUNTER — Ambulatory Visit
Admission: RE | Admit: 2014-10-20 | Discharge: 2014-10-20 | Disposition: A | Discharge status: Home / Self Care | Payer: BC Managed Care – PPO | Source: Ambulatory Visit | Attending: Nurse Practitioner | Admitting: Nurse Practitioner

## 2014-10-20 DIAGNOSIS — G43009 Migraine without aura, not intractable, without status migrainosus: Secondary | ICD-10-CM

## 2014-10-29 DIAGNOSIS — M76829 Posterior tibial tendinitis, unspecified leg: Secondary | ICD-10-CM | POA: Insufficient documentation

## 2014-11-03 NOTE — Progress Notes (Signed)
I reviewed note and agree with plan.   VIKRAM R. PENUMALLI, MD  Certified in Neurology, Neurophysiology and Neuroimaging  [facility] [address], [address] [place], [place] [address] [phone]   

## 2014-11-30 DIAGNOSIS — M79673 Pain in unspecified foot: Secondary | ICD-10-CM | POA: Insufficient documentation

## 2014-11-30 DIAGNOSIS — M869 Osteomyelitis, unspecified: Secondary | ICD-10-CM | POA: Insufficient documentation

## 2014-11-30 DIAGNOSIS — M779 Enthesopathy, unspecified: Secondary | ICD-10-CM | POA: Insufficient documentation

## 2014-11-30 DIAGNOSIS — M722 Plantar fascial fibromatosis: Secondary | ICD-10-CM | POA: Insufficient documentation

## 2015-01-02 ENCOUNTER — Ambulatory Visit: Payer: BC Managed Care – PPO | Admitting: Diagnostic Neuroimaging

## 2015-01-12 ENCOUNTER — Ambulatory Visit: Payer: Self-pay | Admitting: Diagnostic Neuroimaging

## 2015-08-17 ENCOUNTER — Ambulatory Visit (INDEPENDENT_AMBULATORY_CARE_PROVIDER_SITE_OTHER): Payer: Managed Care, Other (non HMO) | Admitting: Family Medicine

## 2015-08-17 ENCOUNTER — Encounter: Payer: Self-pay | Admitting: Family Medicine

## 2015-08-17 VITALS — BP 124/78 | HR 75 | Temp 98.5°F | Ht 69.0 in | Wt 282.0 lb

## 2015-08-17 DIAGNOSIS — M545 Low back pain, unspecified: Secondary | ICD-10-CM | POA: Insufficient documentation

## 2015-08-17 DIAGNOSIS — M79602 Pain in left arm: Secondary | ICD-10-CM | POA: Diagnosis not present

## 2015-08-17 DIAGNOSIS — Z0000 Encounter for general adult medical examination without abnormal findings: Secondary | ICD-10-CM

## 2015-08-17 DIAGNOSIS — G8929 Other chronic pain: Secondary | ICD-10-CM | POA: Insufficient documentation

## 2015-08-17 MED ORDER — HYDROCHLOROTHIAZIDE 25 MG PO TABS: 25.0000 mg | ORAL_TABLET | Freq: Every day | ORAL | Status: DC

## 2015-08-17 MED ORDER — PHENTERMINE HCL 37.5 MG PO CAPS: 37.5000 mg | ORAL_CAPSULE | ORAL | Status: DC

## 2015-08-17 NOTE — Patient Instructions (Signed)
Exercise to Lose Weight Exercise and a healthy diet may help you lose weight. Your doctor may suggest specific exercises. EXERCISE IDEAS AND TIPS  Choose low-cost things you enjoy doing, such as walking, bicycling, or exercising to workout videos.  Take stairs instead of the elevator.  Walk during your lunch break.  Park your car further away from work or school.  Go to a gym or an exercise class.  Start with 5 to 10 minutes of exercise each day. Build up to 30 minutes of exercise 4 to 6 days a week.  Wear shoes with good support and comfortable clothes.  Stretch before and after working out.  Work out until you breathe harder and your heart beats faster.  Drink extra water when you exercise.  Do not do so much that you hurt yourself, feel dizzy, or get very short of breath. Exercises that burn about 150 calories:  Running 1  miles in 15 minutes.  Playing volleyball for 45 to 60 minutes.  Washing and waxing a car for 45 to 60 minutes.  Playing touch football for 45 minutes.  Walking 1  miles in 35 minutes.  Pushing a stroller 1  miles in 30 minutes.  Playing basketball for 30 minutes.  Raking leaves for 30 minutes.  Bicycling 5 miles in 30 minutes.  Walking 2 miles in 30 minutes.  Dancing for 30 minutes.  Shoveling snow for 15 minutes.  Swimming laps for 20 minutes.  Walking up stairs for 15 minutes.  Bicycling 4 miles in 15 minutes.  Gardening for 30 to 45 minutes.  Jumping rope for 15 minutes.  Washing windows or floors for 45 to 60 minutes. Document Released: 12/17/2010 Document Revised: 02/06/2012 Document Reviewed: 12/17/2010 ExitCare Patient Information 2015 [employer]. This information is not intended to replace advice given to you by your health care provider. Make sure you discuss any questions you have with your health care provider.  

## 2015-08-17 NOTE — Assessment & Plan Note (Signed)
?   Coming from neck/ upper back and from large breasts Pt will work on weight loss before doing anything else unless symptoms worsen

## 2015-08-17 NOTE — Progress Notes (Signed)
Pre visit review using our clinic review tool, if applicable. No additional management support is needed unless otherwise documented below in the visit note. 

## 2015-08-17 NOTE — Progress Notes (Signed)
Patient ID: Alondria Mousseau, female   DOB: [birth year], 29 y.o.   MRN: [id number]   Subjective:    Patient ID: Montie Gelardi, female    DOB: [birth year], 29 y.o.   MRN: [id number]  Chief Complaint  Patient presents with  . Establish Care    Edema-- c/o bilateral feet swelling and pain x's 1 week  . Obesity    Would like to disucss something to help  . Back Pain    wants to discuss Breast Reduction  . Numbness    c/o numbness to the left arm that comes and goes x's a few mos    HPI Patient is in today to establish.  She is also c/o L arm numbness and has issues with upper back an neck pain in the past.   She had looked into a breast reduction a few years back but they told her to lose some weight first.   She has been working hard and has lost 15 lbs but is frustrated and wants some help.   Past Medical History  Diagnosis Date  . Back pain   . Obesity   . Persistent headaches     Past Surgical History  Procedure Laterality Date  . Bartholin gland cyst excision      Family History  Problem Relation Age of Onset  . Heart disease Mother   . Hypertension Mother   . Hyperlipidemia Mother   . Hypertension Sister   . Asthma Daughter   . Breast cancer Maternal Grandmother     Social History   Social History  . Marital Status: Single    Spouse Name: N/A  . Number of Children: 1  . Years of Education: College   Occupational History  .      [employer]   Social History Main Topics  . Smoking status: Former Smoker    Types: Cigars  . Smokeless tobacco: Never Used  . Alcohol Use: No  . Drug Use: No  . Sexual Activity: Yes    Birth Control/ Protection: Pill   Other Topics Concern  . Not on file   Social History Narrative   Marital Status: Single   Children:  Daughter Teacher, early years/pre)   Pets: None   Living Situation: Lives with her daughter   Occupation: Clinical biochemist Geographical information systems officer)   Education: Fish farm manager  Nurse, adult)    Tobacco Use:   She used to smoke Black & Mild 4 x per week. She quit 3 years ago.     Alcohol Use:  None   Drug Use:  None   Diet:  Regular   Exercise:  None   Hobbies: Makeup, Traveling    Caffeine Use: 1-2 cups daily    Outpatient Prescriptions Prior to Visit  Medication Sig Dispense Refill  . cetirizine (ZYRTEC) 10 MG tablet Take 1 tablet by mouth daily.    Marland Kitchen ibuprofen (ADVIL,MOTRIN) 200 MG tablet Take 200 mg by mouth daily.    . rizatriptan (MAXALT-MLT) 10 MG disintegrating tablet Take 1 tablet (10 mg total) by mouth as needed for migraine. May repeat in 2 hours if needed 9 tablet 6  . topiramate (TOPAMAX) 50 MG tablet Take 2 tablets (100 mg total) by mouth at bedtime. 60 tablet 6  . phenazopyridine (PYRIDIUM) 200 MG tablet Take 1 tablet by mouth 3 (three) times daily as needed.    . predniSONE (DELTASONE) 10 MG tablet Take 3 tablets by mouth daily. X 7days    . promethazine (PHENERGAN) 25 MG  tablet Take 1 tablet by mouth 4 (four) times daily as needed.     No facility-administered medications prior to visit.    No Known Allergies  Review of Systems  Constitutional: Negative for fever, chills and malaise/fatigue.  HENT: Negative for congestion and hearing loss.   Eyes: Negative for discharge.  Respiratory: Negative for cough, sputum production and shortness of breath.   Cardiovascular: Negative for chest pain, palpitations and leg swelling.  Gastrointestinal: Negative for heartburn, nausea, vomiting, abdominal pain, diarrhea, constipation and blood in stool.  Genitourinary: Negative for dysuria, urgency, frequency and hematuria.  Musculoskeletal: Negative for myalgias, back pain and falls.  Skin: Negative for rash.  Neurological: Negative for dizziness, sensory change, loss of consciousness, weakness and headaches.  Endo/Heme/Allergies: Negative for environmental allergies. Does not bruise/bleed easily.  Psychiatric/Behavioral: Negative for depression and suicidal ideas. The patient is not  nervous/anxious and does not have insomnia.        Objective:    Physical Exam  Constitutional: She is oriented to person, place, and time. She appears well-developed and well-nourished.  HENT:  Head: Normocephalic and atraumatic.  Eyes: Conjunctivae and EOM are normal.  Neck: Normal range of motion. Neck supple. No JVD present. Carotid bruit is not present. No thyromegaly present.  Cardiovascular: Normal rate, regular rhythm and normal heart sounds.   No murmur heard. Pulmonary/Chest: Effort normal and breath sounds normal. No respiratory distress. She has no wheezes. She has no rales. She exhibits no tenderness.  Musculoskeletal: She exhibits no edema.  Neurological: She is alert and oriented to person, place, and time.  Psychiatric: She has a normal mood and affect. Her behavior is normal.  Nursing note and vitals reviewed.   BP 124/78 mmHg  Pulse 75  Temp(Src) 98.5 F (36.9 C) (Oral)  Ht  (1.753 m)  Wt 282 lb (127.914 kg)  BMI 41.63 kg/m2  SpO2 98%  LMP 07/20/2015 Wt Readings from Last 3 Encounters:  08/17/15 282 lb (127.914 kg)  10/01/14 296 lb (134.265 kg)  07/22/14 298 lb 12.8 oz (135.535 kg)     Lab Results  Component Value Date   WBC 9.8 07/22/2014   HGB 12.5 07/22/2014   HCT 37.8 07/22/2014   PLT 375 07/22/2014   GLUCOSE 73 07/22/2014   ALT 15 07/22/2014   AST 15 07/22/2014   NA 142 07/22/2014   K 4.0 07/22/2014   CL 102 07/22/2014   CREATININE 0.86 07/22/2014   BUN 13 07/22/2014   CO2 26 07/22/2014   TSH 2.550 07/22/2014   HGBA1C 6.0* 07/22/2014    Lab Results  Component Value Date   TSH 2.550 07/22/2014   Lab Results  Component Value Date   WBC 9.8 07/22/2014   HGB 12.5 07/22/2014   HCT 37.8 07/22/2014   MCV 88 07/22/2014   PLT 375 07/22/2014   Lab Results  Component Value Date   NA 142 07/22/2014   K 4.0 07/22/2014   CO2 26 07/22/2014   GLUCOSE 73 07/22/2014   BUN 13 07/22/2014   CREATININE 0.86 07/22/2014   BILITOT 0.3  07/22/2014   ALKPHOS 66 07/22/2014   AST 15 07/22/2014   ALT 15 07/22/2014   PROT 7.2 07/22/2014   ALBUMIN 3.6 05/27/2013   CALCIUM 8.8 07/22/2014   No results found for: CHOL No results found for: HDL No results found for: LDLCALC No results found for: TRIG No results found for: [facility] Lab Results  Component Value Date   HGBA1C 6.0* 07/22/2014  Assessment & Plan:   Problem List Items Addressed This Visit    Obesity, morbid    Pt is on topamax for migraines  Will add phenteramine con't diet and exercise rto 1 month or sooner prn ekg--nsr  D/w side effects and she understands.      Relevant Medications   phentermine 37.5 MG capsule   Left arm pain    ? Coming from neck/ upper back and from large breasts Pt will work on weight loss before doing anything else unless symptoms worsen       Other Visit Diagnoses    Preventative health care    -  Primary    Relevant Orders    CBC with Differential/Platelet    Comprehensive metabolic panel    Lipid panel    TSH    EKG 12-Lead (Completed)    Severe obesity (BMI >= 40)        Relevant Medications    phentermine 37.5 MG capsule    Other Relevant Orders    EKG 12-Lead (Completed)       I have discontinued Ms. Verastegui phenazopyridine, promethazine, and predniSONE. I am also having her start on phentermine. Additionally, I am having her maintain her ibuprofen, cetirizine, topiramate, rizatriptan, etonogestrel, and hydrochlorothiazide.  Meds ordered this encounter  Medications  . etonogestrel (NEXPLANON) 68 MG IMPL implant    Sig: 68 mg.  . DISCONTD: hydrochlorothiazide (HYDRODIURIL) 25 MG tablet    Sig: Take 1 tablet (25 mg total) by mouth daily.    Dispense:  90 tablet    Refill:  3  . hydrochlorothiazide (HYDRODIURIL) 25 MG tablet    Sig: Take 1 tablet (25 mg total) by mouth daily.    Dispense:  90 tablet    Refill:  3  . phentermine 37.5 MG capsule    Sig: Take 1 capsule (37.5 mg total) by mouth  every morning.    Dispense:  30 capsule    Refill:  0     Loreen Freud, DO

## 2015-08-17 NOTE — Assessment & Plan Note (Signed)
Pt is on topamax for migraines  Will add phenteramine con't diet and exercise rto 1 month or sooner prn ekg--nsr  D/w side effects and she understands.

## 2015-08-18 LAB — CBC WITH DIFFERENTIAL/PLATELET
Basophils Absolute: 0 10*3/uL (ref 0.0–0.1)
Basophils Relative: 0.3 % (ref 0.0–3.0)
EOS PCT: 1.5 % (ref 0.0–5.0)
Eosinophils Absolute: 0.1 10*3/uL (ref 0.0–0.7)
HCT: 35.8 % — ABNORMAL LOW (ref 36.0–46.0)
Hemoglobin: 11.6 g/dL — ABNORMAL LOW (ref 12.0–15.0)
LYMPHS ABS: 4 10*3/uL (ref 0.7–4.0)
Lymphocytes Relative: 41.6 % (ref 12.0–46.0)
MCHC: 32.5 g/dL (ref 30.0–36.0)
MCV: 89.3 fl (ref 78.0–100.0)
MONOS PCT: 6.1 % (ref 3.0–12.0)
Monocytes Absolute: 0.6 10*3/uL (ref 0.1–1.0)
NEUTROS ABS: 4.9 10*3/uL (ref 1.4–7.7)
NEUTROS PCT: 50.5 % (ref 43.0–77.0)
PLATELETS: 327 10*3/uL (ref 150.0–400.0)
RBC: 4 Mil/uL (ref 3.87–5.11)
RDW: 15.4 % (ref 11.5–15.5)
WBC: 9.6 10*3/uL (ref 4.0–10.5)

## 2015-08-18 LAB — COMPREHENSIVE METABOLIC PANEL
ALK PHOS: 63 U/L (ref 39–117)
ALT: 21 U/L (ref 0–35)
AST: 22 U/L (ref 0–37)
Albumin: 3.8 g/dL (ref 3.5–5.2)
BUN: 13 mg/dL (ref 6–23)
CO2: 29 mEq/L (ref 19–32)
Calcium: 9.3 mg/dL (ref 8.4–10.5)
Chloride: 106 mEq/L (ref 96–112)
Creatinine, Ser: 0.9 mg/dL (ref 0.40–1.20)
GFR: 93.92 mL/min (ref >60.00)
GLUCOSE: 69 mg/dL — AB (ref 70–99)
POTASSIUM: 3.8 meq/L (ref 3.5–5.1)
Sodium: 141 mEq/L (ref 135–145)
TOTAL PROTEIN: 7.6 g/dL (ref 6.0–8.3)
Total Bilirubin: 0.4 mg/dL (ref 0.2–1.2)

## 2015-08-18 LAB — LIPID PANEL
CHOLESTEROL: 153 mg/dL (ref 0–200)
HDL: 46.3 mg/dL (ref >39.00)
LDL CALC: 91 mg/dL (ref 0–99)
NonHDL: 106.63
TRIGLYCERIDES: 78 mg/dL (ref 0.0–149.0)
Total CHOL/HDL Ratio: 3
VLDL: 15.6 mg/dL (ref 0.0–40.0)

## 2015-08-18 LAB — TSH: TSH: 0.85 u[IU]/mL (ref 0.35–4.50)

## 2015-09-15 ENCOUNTER — Ambulatory Visit (INDEPENDENT_AMBULATORY_CARE_PROVIDER_SITE_OTHER): Payer: Managed Care, Other (non HMO) | Admitting: Family Medicine

## 2015-09-15 ENCOUNTER — Encounter: Payer: Self-pay | Admitting: Family Medicine

## 2015-09-15 MED ORDER — PHENTERMINE HCL 37.5 MG PO CAPS: 37.5000 mg | ORAL_CAPSULE | ORAL | Status: DC

## 2015-09-15 NOTE — Patient Instructions (Signed)

## 2015-09-15 NOTE — Progress Notes (Signed)
Patient ID: Joan Harris, female    DOB: Apr 08, 1986  Age: 29 y.o. MRN: [id number]    Subjective:  Subjective HPI Joan Harris presents for f/u weight.  She has been exercising and eating healthy but only eating 2 meals a day.    Review of Systems  Constitutional: Negative for diaphoresis, appetite change, fatigue and unexpected weight change.  Eyes: Negative for pain, redness and visual disturbance.  Respiratory: Negative for cough, chest tightness, shortness of breath and wheezing.   Cardiovascular: Negative for chest pain, palpitations and leg swelling.  Endocrine: Negative for cold intolerance, heat intolerance, polydipsia, polyphagia and polyuria.  Genitourinary: Negative for dysuria, frequency and difficulty urinating.  Neurological: Negative for dizziness, light-headedness, numbness and headaches.  Psychiatric/Behavioral: Negative for dysphoric mood and decreased concentration. The patient is not nervous/anxious.     History Past Medical History  Diagnosis Date  . Back pain   . Obesity   . Persistent headaches     She has past surgical history that includes Bartholin gland cyst excision.   Her family history includes Asthma in her daughter; Breast cancer in her maternal grandmother; Heart disease in her mother; Hyperlipidemia in her mother; Hypertension in her mother and sister.She reports that she has quit smoking. Her smoking use included Cigars. She has never used smokeless tobacco. She reports that she does not drink alcohol or use illicit drugs.  Current Outpatient Prescriptions on File Prior to Visit  Medication Sig Dispense Refill  . cetirizine (ZYRTEC) 10 MG tablet Take 1 tablet by mouth daily.    Marland Kitchen etonogestrel (NEXPLANON) 68 MG IMPL implant 68 mg.    . hydrochlorothiazide (HYDRODIURIL) 25 MG tablet Take 1 tablet (25 mg total) by mouth daily. 90 tablet 3  . ibuprofen (ADVIL,MOTRIN) 200 MG tablet Take 200 mg by mouth daily.    . rizatriptan (MAXALT-MLT) 10  MG disintegrating tablet Take 1 tablet (10 mg total) by mouth as needed for migraine. May repeat in 2 hours if needed 9 tablet 6  . topiramate (TOPAMAX) 50 MG tablet Take 2 tablets (100 mg total) by mouth at bedtime. 60 tablet 6   No current facility-administered medications on file prior to visit.     Objective:  Objective Physical Exam  Constitutional: She is oriented to person, place, and time. She appears well-developed and well-nourished.  HENT:  Head: Normocephalic and atraumatic.  Eyes: Conjunctivae and EOM are normal.  Neck: Normal range of motion. Neck supple. No JVD present. Carotid bruit is not present. No thyromegaly present.  Cardiovascular: Normal rate, regular rhythm and normal heart sounds.   No murmur heard. Pulmonary/Chest: Effort normal and breath sounds normal. No respiratory distress. She has no wheezes. She has no rales. She exhibits no tenderness.  Musculoskeletal: She exhibits no edema.  Neurological: She is alert and oriented to person, place, and time.  Psychiatric: She has a normal mood and affect. Her behavior is normal.   BP 123/84 mmHg  Pulse 87  Temp(Src) 98.3 F (36.8 C) (Oral)  Ht  (1.753 m)  Wt 271 lb 6.4 oz (123.106 kg)  BMI 40.06 kg/m2  LMP 07/20/2015 Wt Readings from Last 3 Encounters:  09/15/15 271 lb 6.4 oz (123.106 kg)  08/17/15 282 lb (127.914 kg)  10/01/14 296 lb (134.265 kg)     Lab Results  Component Value Date   WBC 9.6 08/17/2015   HGB 11.6* 08/17/2015   HCT 35.8* 08/17/2015   PLT 327.0 08/17/2015   GLUCOSE 69* 08/17/2015   CHOL 153  08/17/2015   TRIG 78.0 08/17/2015   HDL 46.30 08/17/2015   LDLCALC 91 08/17/2015   ALT 21 08/17/2015   AST 22 08/17/2015   NA 141 08/17/2015   K 3.8 08/17/2015   CL 106 08/17/2015   CREATININE 0.90 08/17/2015   BUN 13 08/17/2015   CO2 29 08/17/2015   TSH 0.85 08/17/2015   HGBA1C 6.0* 07/22/2014    Mr Brain Wo Contrast  10/20/2014  [facility] NEUR[facility]  REPORT STUDY DATE: 10/20/14 PATIENT NAME: Joan Harris DOB: 01/14/1986 MRN: [id number] ORDERING CLINICIAN: Heide GuileLynn Lam, NP / Joycelyn SchmidVikram Penumalli, MD CLINICAL HISTORY: 29 year old female with headaches. EXAM: MRI brain (without) TECHNIQUE: MRI of the brain without contrast was obtained utilizing 5 mm axial slices with T1, T2, T2 flair, SWI and diffusion weighted views.  T1 sagittal and T2 coronal views were obtained. CONTRAST: no IMAGING SITE: [facility] [address] (1.5 Tesla MRI)  FINDINGS: No abnormal lesions are seen on diffusion-weighted views to suggest acute ischemia. The cortical sulci, fissures and cisterns are normal in size and appearance. Lateral, third and fourth ventricle are normal in size and appearance. No extra-axial fluid collections are seen. No evidence of mass effect or midline shift.  On sagittal views the posterior fossa, pituitary gland and corpus callosum are notable for partially empty sella. No evidence of intracranial hemorrhage on SWI views. The orbits and their contents, paranasal sinuses and calvarium are notable for slightly enlarged right optic nerve sheath. Intracranial flow voids are present.   10/20/2014  Equivocal MRI brain (without) demonstrating: 1. 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 This is non-specific but can be seen with idiopathic intracranial hypertension. 2. No acute findings. INTERPRETING PHYSICIAN: Suanne MarkerVIKRAM R. PENUMALLI, MD Certified in Neurology, Neurophysiology and Neuroimaging [facility] [address], [address] [place], [place] [address] [phone]     Assessment & Plan:  Plan I am having Joan Harris maintain her ibuprofen, cetirizine, topiramate, rizatriptan, etonogestrel, hydrochlorothiazide, and phentermine.  Meds ordered this encounter  Medications  . phentermine 37.5 MG capsule    Sig: Take 1 capsule (37.5 mg total) by mouth every morning.    Dispense:  30 capsule    Refill:   0    Problem List Items Addressed This Visit    Obesity, morbid (HCC) - Primary    con't diet and exercise Refill phenteramine rto 1 month Encouraged pt to eat 3x a day so metabolism does not slow down      Relevant Medications   phentermine 37.5 MG capsule      Follow-up: Return in about 4 weeks (around 10/13/2015), or if symptoms worsen or fail to improve.  Loreen FreudYvonne Lowne, DO

## 2015-09-15 NOTE — Assessment & Plan Note (Addendum)
con't diet and exercise Refill phenteramine rto 1 month Encouraged pt to eat 3x a day so metabolism does not slow down

## 2015-09-15 NOTE — Progress Notes (Signed)
Pre visit review using our clinic review tool, if applicable. No additional management support is needed unless otherwise documented below in the visit note. 

## 2015-10-16 ENCOUNTER — Encounter: Payer: Self-pay | Admitting: Family Medicine

## 2015-10-16 ENCOUNTER — Ambulatory Visit (INDEPENDENT_AMBULATORY_CARE_PROVIDER_SITE_OTHER): Payer: Managed Care, Other (non HMO) | Admitting: Family Medicine

## 2015-10-16 MED ORDER — PHENTERMINE HCL 37.5 MG PO CAPS: 37.5000 mg | ORAL_CAPSULE | ORAL | Status: DC

## 2015-10-16 NOTE — Assessment & Plan Note (Signed)
con't diet and exercise Refill phenteramine-- will give this month and next and then take a break for 4-6 months

## 2015-10-16 NOTE — Progress Notes (Signed)
Pre visit review using our clinic review tool, if applicable. No additional management support is needed unless otherwise documented below in the visit note. 

## 2015-10-16 NOTE — Progress Notes (Signed)
Patient ID: Joan Harris, female    DOB: October 12, 1986  Age: 29 y.o. MRN: [id number]    Subjective:  Subjective HPI Joan Harris presents for weight check.  Pt has been walking 3 miles a day and eating healthy.    Review of Systems  Constitutional: Negative for diaphoresis, appetite change, fatigue and unexpected weight change.  Eyes: Negative for pain, redness and visual disturbance.  Respiratory: Negative for cough, chest tightness, shortness of breath and wheezing.   Cardiovascular: Negative for chest pain, palpitations and leg swelling.  Endocrine: Negative for cold intolerance, heat intolerance, polydipsia, polyphagia and polyuria.  Genitourinary: Negative for dysuria, frequency and difficulty urinating.  Neurological: Negative for dizziness, light-headedness, numbness and headaches.    History Past Medical History  Diagnosis Date  . Back pain   . Obesity   . Persistent headaches     She has past surgical history that includes Bartholin gland cyst excision.   Her family history includes Asthma in her daughter; Breast cancer in her maternal grandmother; Heart disease in her mother; Hyperlipidemia in her mother; Hypertension in her mother and sister.She reports that she has quit smoking. Her smoking use included Cigars. She has never used smokeless tobacco. She reports that she does not drink alcohol or use illicit drugs.  Current Outpatient Prescriptions on File Prior to Visit  Medication Sig Dispense Refill  . cetirizine (ZYRTEC) 10 MG tablet Take 1 tablet by mouth daily.    Marland Kitchen etonogestrel (NEXPLANON) 68 MG IMPL implant 68 mg.    . ibuprofen (ADVIL,MOTRIN) 200 MG tablet Take 200 mg by mouth daily.    . rizatriptan (MAXALT-MLT) 10 MG disintegrating tablet Take 1 tablet (10 mg total) by mouth as needed for migraine. May repeat in 2 hours if needed 9 tablet 6   No current facility-administered medications on file prior to visit.     Objective:  Objective Physical  Exam  Constitutional: She is oriented to person, place, and time. She appears well-developed and well-nourished.  HENT:  Head: Normocephalic and atraumatic.  Eyes: Conjunctivae and EOM are normal.  Neck: Normal range of motion. Neck supple. No JVD present. Carotid bruit is not present. No thyromegaly present.  Cardiovascular: Normal rate, regular rhythm and normal heart sounds.   No murmur heard. Pulmonary/Chest: Effort normal and breath sounds normal. No respiratory distress. She has no wheezes. She has no rales. She exhibits no tenderness.  Musculoskeletal: She exhibits no edema.  Neurological: She is alert and oriented to person, place, and time.  Psychiatric: She has a normal mood and affect. Her behavior is normal. Thought content normal.  Nursing note and vitals reviewed.  BP 118/78 mmHg  Temp(Src) 98.4 F (36.9 C) (Oral)  Resp 16  Ht  (1.753 m)  Wt 265 lb (120.203 kg)  BMI 39.12 kg/m2 Wt Readings from Last 3 Encounters:  10/16/15 265 lb (120.203 kg)  09/15/15 271 lb 6.4 oz (123.106 kg)  08/17/15 282 lb (127.914 kg)     Lab Results  Component Value Date   WBC 9.6 08/17/2015   HGB 11.6* 08/17/2015   HCT 35.8* 08/17/2015   PLT 327.0 08/17/2015   GLUCOSE 69* 08/17/2015   CHOL 153 08/17/2015   TRIG 78.0 08/17/2015   HDL 46.30 08/17/2015   LDLCALC 91 08/17/2015   ALT 21 08/17/2015   AST 22 08/17/2015   NA 141 08/17/2015   K 3.8 08/17/2015   CL 106 08/17/2015   CREATININE 0.90 08/17/2015   BUN 13 08/17/2015   CO2  29 08/17/2015   TSH 0.85 08/17/2015   HGBA1C 6.0* 07/22/2014    Mr Brain Wo Contrast  10/20/2014  [facility] NEUROIMAGING REPORT STUDY DATE: 10/20/14 PATIENT NAME: Joan Harris DOB: 04/30/1986 MRN: [id number] ORDERING CLINICIAN: Heide GuileLynn Lam, NP / Joycelyn SchmidVikram Penumalli, MD CLINICAL HISTORY: 29 year old female with headaches. EXAM: MRI brain (without) TECHNIQUE: MRI of the brain without contrast was obtained utilizing 5 mm axial slices  with T1, T2, T2 flair, SWI and diffusion weighted views.  T1 sagittal and T2 coronal views were obtained. CONTRAST: no IMAGING SITE: [facility] [address] (1.5 Tesla MRI)  FINDINGS: No abnormal lesions are seen on diffusion-weighted views to suggest acute ischemia. The cortical sulci, fissures and cisterns are normal in size and appearance. Lateral, third and fourth ventricle are normal in size and appearance. No extra-axial fluid collections are seen. No evidence of mass effect or midline shift.  On sagittal views the posterior fossa, pituitary gland and corpus callosum are notable for partially empty sella. No evidence of intracranial hemorrhage on SWI views. The orbits and their contents, paranasal sinuses and calvarium are notable for slightly enlarged right optic nerve sheath. Intracranial flow voids are present.   10/20/2014  Equivocal MRI brain (without) demonstrating: 1. 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 This is non-specific but can be seen with idiopathic intracranial hypertension. 2. No acute findings. INTERPRETING PHYSICIAN: Suanne MarkerVIKRAM R. PENUMALLI, MD Certified in Neurology, Neurophysiology and Neuroimaging [facility] [address], [address] [place], [place] [address] [phone]     Assessment & Plan:  Plan I have discontinued Joan Harris's topiramate and hydrochlorothiazide. I am also having her maintain her ibuprofen, cetirizine, rizatriptan, etonogestrel, and phentermine.  Meds ordered this encounter  Medications  . phentermine 37.5 MG capsule    Sig: Take 1 capsule (37.5 mg total) by mouth every morning.    Dispense:  30 capsule    Refill:  0    Problem List Items Addressed This Visit    Obesity, morbid (HCC) - Primary    con't diet and exercise Refill phenteramine-- will give this month and next and then take a break for 4-6 months      Relevant Medications   phentermine 37.5 MG capsule       Follow-up: Return in about 4 weeks (around 11/13/2015), or if symptoms worsen or fail to improve, for weight checkl.  Loreen FreudYvonne Lowne, DO

## 2015-10-16 NOTE — Patient Instructions (Signed)

## 2015-11-20 ENCOUNTER — Ambulatory Visit (INDEPENDENT_AMBULATORY_CARE_PROVIDER_SITE_OTHER): Payer: Managed Care, Other (non HMO) | Admitting: Family Medicine

## 2015-11-20 ENCOUNTER — Encounter: Payer: Self-pay | Admitting: Family Medicine

## 2015-11-20 MED ORDER — PHENTERMINE HCL 37.5 MG PO CAPS: 37.5000 mg | ORAL_CAPSULE | ORAL | Status: DC

## 2015-11-20 NOTE — Progress Notes (Signed)
Pre visit review using our clinic review tool, if applicable. No additional management support is needed unless otherwise documented below in the visit note. 

## 2015-11-20 NOTE — Assessment & Plan Note (Signed)
Pt is walking  She is watching her diet Refill phenteramine rto 1 month

## 2015-11-20 NOTE — Patient Instructions (Signed)
Obesity Obesity is defined as having too much total body fat and a body mass index (BMI) of 30 or more. BMI is an estimate of body fat and is calculated from your height and weight. BMI is typically calculated by your health care provider during regular wellness visits. Obesity happens when you consume more calories than you can burn by exercising or performing daily physical tasks. Prolonged obesity can cause major illnesses or emergencies, such as:  Stroke.  Heart disease.  Diabetes.  Cancer.  Arthritis.  High blood pressure (hypertension).  High cholesterol.  Sleep apnea.  Erectile dysfunction.  Infertility problems. CAUSES   Regularly eating unhealthy foods.  Physical inactivity.  Certain disorders, such as an underactive thyroid (hypothyroidism), Cushing's syndrome, and polycystic ovarian syndrome.  Certain medicines, such as steroids, some depression medicines, and antipsychotics.  Genetics.  Lack of sleep. DIAGNOSIS A health care provider can diagnose obesity after calculating your BMI. Obesity will be diagnosed if your BMI is 30 or higher. There are other methods of measuring obesity levels. Some other methods include measuring your skinfold thickness, your waist circumference, and comparing your hip circumference to your waist circumference. TREATMENT  A healthy treatment program includes some or all of the following:  Long-term dietary changes.  Exercise and physical activity.  Behavioral and lifestyle changes.  Medicine only under the supervision of your health care provider. Medicines may help, but only if they are used with diet and exercise programs. If your BMI is 40 or higher, your health care provider may recommend specialized surgery or programs to help with weight loss. An unhealthy treatment program includes:  Fasting.  Fad diets.  Supplements and drugs. These choices do not succeed in long-term weight control. HOME CARE  INSTRUCTIONS  Exercise and perform physical activity as directed by your health care provider. To increase physical activity, try the following:  Use stairs instead of elevators.  Park farther away from store entrances.  Garden, bike, or walk instead of watching television or using the computer.  Eat healthy, low-calorie foods and drinks on a regular basis. Eat more fruits and vegetables. Use low-calorie cookbooks or take healthy cooking classes.  Limit fast food, sweets, and processed snack foods.  Eat smaller portions.  Keep a daily journal of everything you eat. There are many free websites to help you with this. It may be helpful to measure your foods so you can determine if you are eating the correct portion sizes.  Avoid drinking alcohol. Drink more water and drinks without calories.  Take vitamins and supplements only as recommended by your health care provider.  Weight-loss support groups, registered dietitians, counselors, and stress reduction education can also be very helpful. SEEK IMMEDIATE MEDICAL CARE IF:  You have chest pain or tightness.  You have trouble breathing or feel short of breath.  You have weakness or leg numbness.  You feel confused or have trouble talking.  You have sudden changes in your vision.   This information is not intended to replace advice given to you by your health care provider. Make sure you discuss any questions you have with your health care provider.   Document Released: 12/22/2004 Document Revised: 12/05/2014 Document Reviewed: 12/21/2011 Elsevier Interactive Patient Education 2016 [employer] Inc[employer]  

## 2015-11-20 NOTE — Progress Notes (Signed)
Patient ID: Odette Hornsshley Ternes, female    DOB: 1986-03-18  Age: 29 y.o. MRN: [id number]    Subjective:  Subjective HPI Odette Hornsshley Larocca presents for weight check.  She is exercising and watching her diet.    Review of Systems  Constitutional: Negative for diaphoresis, appetite change, fatigue and unexpected weight change.  Eyes: Negative for pain, redness and visual disturbance.  Respiratory: Negative for cough, chest tightness, shortness of breath and wheezing.   Cardiovascular: Negative for chest pain, palpitations and leg swelling.  Endocrine: Negative for cold intolerance, heat intolerance, polydipsia, polyphagia and polyuria.  Genitourinary: Negative for dysuria, frequency and difficulty urinating.  Neurological: Negative for dizziness, light-headedness, numbness and headaches.    History Past Medical History  Diagnosis Date  . Back pain   . Obesity   . Persistent headaches     She has past surgical history that includes Bartholin gland cyst excision.   Her family history includes Asthma in her daughter; Breast cancer in her maternal grandmother; Heart disease in her mother; Hyperlipidemia in her mother; Hypertension in her mother and sister.She reports that she has quit smoking. Her smoking use included Cigars. She has never used smokeless tobacco. She reports that she does not drink alcohol or use illicit drugs.  Current Outpatient Prescriptions on File Prior to Visit  Medication Sig Dispense Refill  . cetirizine (ZYRTEC) 10 MG tablet Take 1 tablet by mouth daily.    Marland Kitchen. etonogestrel (NEXPLANON) 68 MG IMPL implant 68 mg.    . ibuprofen (ADVIL,MOTRIN) 200 MG tablet Take 200 mg by mouth daily.    . rizatriptan (MAXALT-MLT) 10 MG disintegrating tablet Take 1 tablet (10 mg total) by mouth as needed for migraine. May repeat in 2 hours if needed 9 tablet 6   No current facility-administered medications on file prior to visit.     Objective:  Objective Physical Exam    Constitutional: She is oriented to person, place, and time. She appears well-developed and well-nourished.  HENT:  Head: Normocephalic and atraumatic.  Eyes: Conjunctivae and EOM are normal.  Neck: Normal range of motion. Neck supple. No JVD present. Carotid bruit is not present. No thyromegaly present.  Cardiovascular: Normal rate, regular rhythm and normal heart sounds.   No murmur heard. Pulmonary/Chest: Effort normal and breath sounds normal. No respiratory distress. She has no wheezes. She has no rales. She exhibits no tenderness.  Musculoskeletal: She exhibits no edema.  Neurological: She is alert and oriented to person, place, and time.  Psychiatric: She has a normal mood and affect.  Nursing note and vitals reviewed.  BP 116/60 mmHg  Pulse 83  Temp(Src) 98.5 F (36.9 C) (Oral)  Ht 5\' 9"  (1.753 m)  Wt 261 lb (118.389 kg)  BMI 38.53 kg/m2  SpO2 98% Wt Readings from Last 3 Encounters:  11/20/15 261 lb (118.389 kg)  10/16/15 265 lb (120.203 kg)  09/15/15 271 lb 6.4 oz (123.106 kg)     Lab Results  Component Value Date   WBC 9.6 08/17/2015   HGB 11.6* 08/17/2015   HCT 35.8* 08/17/2015   PLT 327.0 08/17/2015   GLUCOSE 69* 08/17/2015   CHOL 153 08/17/2015   TRIG 78.0 08/17/2015   HDL 46.30 08/17/2015   LDLCALC 91 08/17/2015   ALT 21 08/17/2015   AST 22 08/17/2015   NA 141 08/17/2015   K 3.8 08/17/2015   CL 106 08/17/2015   CREATININE 0.90 08/17/2015   BUN 13 08/17/2015   CO2 29 08/17/2015   TSH 0.85 08/17/2015  HGBA1C 6.0* 07/22/2014    Mr Brain Wo Contrast  10/20/2014  [facility] NEUROIMAG[facility] REPORT STUDY DATE: 10/20/14 PATIENT NAME: Louis Gaw DOB: 1986-09-23 MRN: [id number] ORDERING CLINICIAN: Heide Guile, NP / Joycelyn Schmid, MD CLINICAL HISTORY: 29 year old female with headaches. EXAM: MRI brain (without) TECHNIQUE: MRI of the brain without contrast was obtained utilizing 5 mm axial slices with T1, T2, T2 flair, SWI and diffusion  weighted views.  T1 sagittal and T2 coronal views were obtained. CONTRAST: no IMAGING SITE: [facility] [address] (1.5 Tesla MRI)  FINDINGS: No abnormal lesions are seen on diffusion-weighted views to suggest acute ischemia. The cortical sulci, fissures and cisterns are normal in size and appearance. Lateral, third and fourth ventricle are normal in size and appearance. No extra-axial fluid collections are seen. No evidence of mass effect or midline shift.  On sagittal views the posterior fossa, pituitary gland and corpus callosum are notable for partially empty sella. No evidence of intracranial hemorrhage on SWI views. The orbits and their contents, paranasal sinuses and calvarium are notable for slightly enlarged right optic nerve sheath. Intracranial flow voids are present.   10/20/2014  Equivocal MRI brain (without) demonstrating: 1. 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 This is non-specific but can be seen with idiopathic intracranial hypertension. 2. No acute findings. INTERPRETING PHYSICIAN: Suanne Marker, MD Certified in Neurology, Neurophysiology and Neuroimaging [facility] [address] [place], [place] [address] [phone]     Assessment & Plan:  Plan I am having Ms. Dau maintain her ibuprofen, cetirizine, rizatriptan, etonogestrel, and phentermine.  Meds ordered this encounter  Medications  . phentermine 37.5 MG capsule    Sig: Take 1 capsule (37.5 mg total) by mouth every morning.    Dispense:  30 capsule    Refill:  0    Problem List Items Addressed This Visit      Unprioritized   Obesity, morbid (HCC) - Primary    Pt is walking  She is watching her diet Refill phenteramine rto 1 month      Relevant Medications   phentermine 37.5 MG capsule      Follow-up: No Follow-up on file.  Loreen Freud, DO

## 2015-12-18 ENCOUNTER — Ambulatory Visit: Payer: Self-pay | Admitting: Family Medicine

## 2016-05-03 ENCOUNTER — Emergency Department (HOSPITAL_BASED_OUTPATIENT_CLINIC_OR_DEPARTMENT_OTHER)
Admission: EM | Admit: 2016-05-03 | Discharge: 2016-05-04 | Disposition: A | Discharge status: Home / Self Care | Payer: Managed Care, Other (non HMO) | Attending: Emergency Medicine | Admitting: Emergency Medicine

## 2016-05-03 ENCOUNTER — Encounter (HOSPITAL_BASED_OUTPATIENT_CLINIC_OR_DEPARTMENT_OTHER): Payer: Self-pay | Admitting: Emergency Medicine

## 2016-05-03 ENCOUNTER — Emergency Department (HOSPITAL_BASED_OUTPATIENT_CLINIC_OR_DEPARTMENT_OTHER): Payer: Managed Care, Other (non HMO)

## 2016-05-03 DIAGNOSIS — E669 Obesity, unspecified: Secondary | ICD-10-CM | POA: Insufficient documentation

## 2016-05-03 DIAGNOSIS — R1033 Periumbilical pain: Secondary | ICD-10-CM | POA: Diagnosis present

## 2016-05-03 DIAGNOSIS — Z87891 Personal history of nicotine dependence: Secondary | ICD-10-CM | POA: Diagnosis not present

## 2016-05-03 DIAGNOSIS — N760 Acute vaginitis: Secondary | ICD-10-CM | POA: Insufficient documentation

## 2016-05-03 DIAGNOSIS — G43909 Migraine, unspecified, not intractable, without status migrainosus: Secondary | ICD-10-CM | POA: Insufficient documentation

## 2016-05-03 DIAGNOSIS — N83201 Unspecified ovarian cyst, right side: Secondary | ICD-10-CM

## 2016-05-03 DIAGNOSIS — R1031 Right lower quadrant pain: Secondary | ICD-10-CM

## 2016-05-03 DIAGNOSIS — Z6836 Body mass index (BMI) 36.0-36.9, adult: Secondary | ICD-10-CM | POA: Diagnosis not present

## 2016-05-03 DIAGNOSIS — R110 Nausea: Secondary | ICD-10-CM | POA: Diagnosis not present

## 2016-05-03 DIAGNOSIS — B9689 Other specified bacterial agents as the cause of diseases classified elsewhere: Secondary | ICD-10-CM

## 2016-05-03 LAB — CBC WITH DIFFERENTIAL/PLATELET
Basophils Absolute: 0 10*3/uL (ref 0.0–0.1)
Basophils Relative: 0 %
Eosinophils Absolute: 0.1 10*3/uL (ref 0.0–0.7)
Eosinophils Relative: 1 %
HCT: 36.1 % (ref 36.0–46.0)
Hemoglobin: 12 g/dL (ref 12.0–15.0)
Lymphocytes Relative: 42 %
Lymphs Abs: 3.5 10*3/uL (ref 0.7–4.0)
MCH: 29.9 pg (ref 26.0–34.0)
MCHC: 33.2 g/dL (ref 30.0–36.0)
MCV: 89.8 fL (ref 78.0–100.0)
Monocytes Absolute: 0.6 10*3/uL (ref 0.1–1.0)
Monocytes Relative: 7 %
Neutro Abs: 4.2 10*3/uL (ref 1.7–7.7)
Neutrophils Relative %: 50 %
Platelets: 376 10*3/uL (ref 150–400)
RBC: 4.02 MIL/uL (ref 3.87–5.11)
RDW: 14.1 % (ref 11.5–15.5)
WBC: 8.5 10*3/uL (ref 4.0–10.5)

## 2016-05-03 LAB — URINALYSIS, ROUTINE W REFLEX MICROSCOPIC
Bilirubin Urine: NEGATIVE
GLUCOSE, UA: NEGATIVE mg/dL
HGB URINE DIPSTICK: NEGATIVE
Ketones, ur: NEGATIVE mg/dL
Nitrite: NEGATIVE
PH: 5.5 (ref 5.0–8.0)
Protein, ur: NEGATIVE mg/dL
Specific Gravity, Urine: 1.02 (ref 1.005–1.030)

## 2016-05-03 LAB — COMPREHENSIVE METABOLIC PANEL
ALT: 29 U/L (ref 14–54)
AST: 30 U/L (ref 15–41)
Albumin: 3.9 g/dL (ref 3.5–5.0)
Alkaline Phosphatase: 61 U/L (ref 38–126)
Anion gap: 8 (ref 5–15)
BUN: 10 mg/dL (ref 6–20)
CO2: 27 mmol/L (ref 22–32)
Calcium: 9.2 mg/dL (ref 8.9–10.3)
Chloride: 104 mmol/L (ref 101–111)
Creatinine, Ser: 0.88 mg/dL (ref 0.44–1.00)
GFR calc Af Amer: >60 mL/min (ref >60)
GFR calc non Af Amer: >60 mL/min (ref >60)
Glucose, Bld: 91 mg/dL (ref 65–99)
Potassium: 3.4 mmol/L — ABNORMAL LOW (ref 3.5–5.1)
Sodium: 139 mmol/L (ref 135–145)
Total Bilirubin: 0.3 mg/dL (ref 0.3–1.2)
Total Protein: 7.5 g/dL (ref 6.5–8.1)

## 2016-05-03 LAB — URINE MICROSCOPIC-ADD ON

## 2016-05-03 LAB — WET PREP, GENITAL
Sperm: NONE SEEN
Trich, Wet Prep: NONE SEEN
Yeast Wet Prep HPF POC: NONE SEEN

## 2016-05-03 LAB — PREGNANCY, URINE: Preg Test, Ur: NEGATIVE

## 2016-05-03 LAB — LIPASE, BLOOD: Lipase: 16 U/L (ref 11–51)

## 2016-05-03 MED ORDER — IOPAMIDOL (ISOVUE-300) INJECTION 61%
100.0000 mL | Freq: Once | INTRAVENOUS | Status: AC | PRN
  Administered 2016-05-03: 100 mL via INTRAVENOUS

## 2016-05-03 MED ORDER — DIPHENHYDRAMINE HCL 50 MG/ML IJ SOLN
25.0000 mg | Freq: Once | INTRAMUSCULAR | Status: AC
  Administered 2016-05-03: 25 mg via INTRAVENOUS
  Filled 2016-05-03: qty 1

## 2016-05-03 MED ORDER — SODIUM CHLORIDE 0.9 % IV BOLUS (SEPSIS)
1000.0000 mL | Freq: Once | INTRAVENOUS | Status: AC
  Administered 2016-05-03: 1000 mL via INTRAVENOUS

## 2016-05-03 MED ORDER — ONDANSETRON HCL 4 MG/2ML IJ SOLN: 4.0000 mg | Freq: Once | INTRAMUSCULAR | Status: DC

## 2016-05-03 MED ORDER — KETOROLAC TROMETHAMINE 30 MG/ML IJ SOLN
15.0000 mg | Freq: Once | INTRAMUSCULAR | Status: AC
  Administered 2016-05-03: 15 mg via INTRAVENOUS
  Filled 2016-05-03: qty 1

## 2016-05-03 MED ORDER — METOCLOPRAMIDE HCL 5 MG/ML IJ SOLN
10.0000 mg | Freq: Once | INTRAMUSCULAR | Status: AC
  Administered 2016-05-03: 10 mg via INTRAVENOUS
  Filled 2016-05-03: qty 2

## 2016-05-03 NOTE — ED Notes (Signed)
abd pain with nausea  Onset yesterday

## 2016-05-03 NOTE — ED Provider Notes (Signed)
CSN: [id number]     Arrival date & time 05/03/16  1840 History   First MD Initiated Contact with Patient 05/03/16 1952     Chief Complaint  Patient presents with  . Abdominal Pain    HPI   30 year old female presents today with complaints of abdominal pain. Patient reports that approximately one week ago she started exercising doing sit ups. She reports that she started to develop sharp periumbilical pain several days ago. Patient reports that yesterday she was unable to sit up without significant pain in her abdomen. She reports that pain has radiated down into her right lower quadrant, pain with palpation. Patient reports she feels nauseous, denies any vomiting. She denies any change in vaginal discharge, bleeding. She reports that her urine and bowel movements have been normal. She reports she's been a little tolerated by mouth without difficulty. She denies any history of the same. Patient denies any abdominal surgical history. Pt also reports bilateral lower back pain, no distal deficits.   Patient also reports that due to the pain she has started to develop a migraine. She reports is typical of her previous migraines for which she takes medication. She notes that is global, with no neurological deficits, no concerning signs or symptoms compared to previous migraines. No red flags.    Past Medical History  Diagnosis Date  . Back pain   . Obesity   . Persistent headaches    Past Surgical History  Procedure Laterality Date  . Bartholin gland cyst excision     Family History  Problem Relation Age of Onset  . Heart disease Mother   . Hypertension Mother   . Hyperlipidemia Mother   . Hypertension Sister   . Asthma Daughter   . Breast cancer Maternal Grandmother    Social History  Substance Use Topics  . Smoking status: Former Smoker    Types: Cigars  . Smokeless tobacco: Never Used  . Alcohol Use: No   OB History    No data available     Review of Systems  All other  systems reviewed and are negative.   Allergies  Review of patient's allergies indicates no known allergies.  Home Medications   Prior to Admission medications   Medication Sig Start Date End Date Taking? Authorizing Provider  cetirizine (ZYRTEC) 10 MG tablet Take 1 tablet by mouth daily. 01/16/13   Historical Provider, MD  ciprofloxacin (CIPRO) 500 MG tablet Take 1 tablet (500 mg total) by mouth 2 (two) times daily. 05/04/16   Eyvonne Mechanic, PA-C  etonogestrel (NEXPLANON) 68 MG IMPL implant 68 mg. 12/15/14   Historical Provider, MD  ibuprofen (ADVIL,MOTRIN) 200 MG tablet Take 200 mg by mouth daily.    Historical Provider, MD  metroNIDAZOLE (FLAGYL) 500 MG tablet Take 1 tablet (500 mg total) by mouth 2 (two) times daily. 05/04/16   Tinnie Gens Reneisha Stilley, PA-C  ondansetron (ZOFRAN) 4 MG tablet Take 1 tablet (4 mg total) by mouth every 6 (six) hours. 05/04/16   Eyvonne Mechanic, PA-C   BP 116/82 mmHg  Pulse 65  Temp(Src) 98.6 F (37 C) (Oral)  Resp 18  Ht 5\' 10"  (1.778 m)  Wt 115.214 kg  BMI 36.45 kg/m2  SpO2 100% Physical Exam  Constitutional: She is oriented to person, place, and time. She appears well-developed and well-nourished.  obese  HENT:  Head: Normocephalic and atraumatic.  Eyes: Conjunctivae are normal. Pupils are equal, round, and reactive to light. Right eye exhibits no discharge. Left eye exhibits no discharge.  No scleral icterus.  Neck: Normal range of motion. No JVD present. No tracheal deviation present.  Pulmonary/Chest: Effort normal. No stridor.  Abdominal: Hernia confirmed negative in the right inguinal area and confirmed negative in the left inguinal area.    Tenderness to palpation of periumbilical, right lower quadrant. No upper abdominal pain, left-sided abdominal pain, no rebound. Obese abdomen; unable to feel hernia  Genitourinary: Vagina normal and uterus normal. There is no rash, tenderness, lesion or injury on the right labia. There is no rash, tenderness, lesion or  injury on the left labia. Cervix exhibits no motion tenderness, no discharge and no friability. Right adnexum displays no mass, no tenderness and no fullness. Left adnexum displays no mass, no tenderness and no fullness. No erythema, tenderness or bleeding in the vagina. No foreign body around the vagina. No signs of injury around the vagina. No vaginal discharge found.  Lymphadenopathy:       Right: No inguinal adenopathy present.       Left: No inguinal adenopathy present.  Neurological: She is alert and oriented to person, place, and time. Coordination normal.  Psychiatric: She has a normal mood and affect. Her behavior is normal. Judgment and thought content normal.  Nursing note and vitals reviewed.   ED Course  Procedures (including critical care time) Labs Review Labs Reviewed  WET PREP, GENITAL - Abnormal; Notable for the following:    Clue Cells Wet Prep HPF POC PRESENT (*)    WBC, Wet Prep HPF POC MANY (*)    All other components within normal limits  URINALYSIS, ROUTINE W REFLEX MICROSCOPIC (NOT AT [facility]) - Abnormal; Notable for the following:    APPearance TURBID (*)    Leukocytes, UA LARGE (*)    All other components within normal limits  URINE MICROSCOPIC-ADD ON - Abnormal; Notable for the following:    Squamous Epithelial / LPF TOO NUMEROUS TO COUNT (*)    Bacteria, UA MANY (*)    All other components within normal limits  COMPREHENSIVE METABOLIC PANEL - Abnormal; Notable for the following:    Potassium 3.4 (*)    All other components within normal limits  PREGNANCY, URINE  CBC WITH DIFFERENTIAL/PLATELET  LIPASE, BLOOD  GC/CHLAMYDIA PROBE AMP ([place]) NOT AT [facility]    Imaging Review Ct Abdomen Pelvis W Contrast  05/03/2016  CLINICAL DATA:  30 year old female with right lower quadrant abdominal pain and nausea. EXAM: CT ABDOMEN AND PELVIS WITH CONTRAST TECHNIQUE: Multidetector CT imaging of the abdomen and pelvis was performed using the standard protocol following  bolus administration of intravenous contrast. CONTRAST:  ISOVUE-300 IOPAMIDOL (ISOVUE-300) INJECTION 61% COMPARISON:  CT dated 02/05/2013 FINDINGS: The visualized lung bases are clear. No intra-abdominal free air. Trace free fluid within the pelvis. All the liver, gallbladder, pancreas, spleen, adrenal glands is trauma kidneys:, visualized ureters, and urinary bladder appear unremarkable. The uterus is anteverted and grossly unremarkable. There is a 2.5 cm right ovarian dominant follicle/cyst. Ultrasound may provide better evaluation of the pelvic structures. There is moderate stool throughout the colon. No evidence of bowel obstruction or active inflammation. Normal appendix. The abdominal aorta and IVC appear unremarkable. No portal venous gas identified. There is no adenopathy. There is a midline vertical anterior pelvic wall incisional scar. A small fat containing umbilical hernia noted. The abdominal wall soft tissues are otherwise unremarkable. The osseous structures are intact. IMPRESSION: A 2.5 cm dominant right ovarian cyst. Ultrasound may provide better evaluation of the pelvic structures. No other acute intra-abdominal  or pelvic pathology identified. Electronically Signed   By: Elgie CollardArash  Radparvar M.D.   On: 05/03/2016 22:26   I have personally reviewed and evaluated these images and lab results as part of my medical decision-making.   EKG Interpretation None      MDM   Final diagnoses:  Right lower quadrant abdominal pain  Nausea  BV (bacterial vaginosis)  Cyst of right ovary   Labs: CBC, CMP, lipase, urinalysis  Imaging: CT abdomen and pelvis with contrast  Consults:   Therapeutics: Toradol, no saline, Benadryl, Reglan  Discharge Meds: Cipro, metronidazole, Zofran  Assessment/Plan: 30 year old female presents today with complaints of abdominal pain. Patient's symptoms started after knee exercise routine with sit-ups, likely muscular in nature. Worse with sitting up,  tenderness to palpation of the abdominal wall. Due to patient's reported location of pain and nausea, abdominal CT was ordered to rule out any significant intra-abdominal pathology. Patient was noted to have a right ovarian cyst, this pain is higher up in the abdomen, unlikely related to those findings. Patient has no significant findings on CT scan, no significant findings on laboratory analysis necessitate further evaluation or management here in the ED. Abdominal pain significant improved after Toradol here in the ED; very minor tenderness to palpation. She is afebrile, nontoxic tolerating by mouth without difficulty. Patient also had a migraine here in the ED, no red flags, similar to previous, she was treated with medications which not only improved her migraine, but also improving abdominal pain. To have urinary tract infection, she will be treated for this, also noted have bacterial vaginosis also will be treated. Patient had no cervical motion tenderness, significant discharge to indicate a pelvic inflammatory disease, or any pelvic pathology requiring further evaluation or management in this setting. Patient will be instructed to use Ultram as needed for discomfort, Zofran as needed for nausea, take antibiotics as directed, monitor for any new or worsening signs or symptoms. She is instructed to return immediately to the emergency room if any present. She verbalized understanding and agreement today's plan had no further questions or concerns at time of discharge        Eyvonne MechanicJeffrey Zyanya Glaza, PA-C 05/04/16 0016  Doug SouSam Jacubowitz, MD 05/04/16 [phone]

## 2016-05-04 LAB — GC/CHLAMYDIA PROBE AMP (~~LOC~~) NOT AT ARMC
Chlamydia: NEGATIVE
Neisseria Gonorrhea: NEGATIVE

## 2016-05-04 MED ORDER — METRONIDAZOLE 500 MG PO TABS: 500.0000 mg | ORAL_TABLET | Freq: Two times a day (BID) | ORAL | Status: DC

## 2016-05-04 MED ORDER — CIPROFLOXACIN HCL 500 MG PO TABS: 500.0000 mg | ORAL_TABLET | Freq: Two times a day (BID) | ORAL | Status: DC

## 2016-05-04 MED ORDER — ONDANSETRON HCL 4 MG PO TABS: 4.0000 mg | ORAL_TABLET | Freq: Four times a day (QID) | ORAL | Status: DC

## 2016-05-04 MED FILL — metroNIDAZOLE 500 MG TABS: 500 | 7 days supply | Qty: 14 | Fill #0

## 2016-05-04 MED FILL — ONDANSETRON HCL 4 MG TABLET: 4 | 3 days supply | Qty: 12 | Fill #0

## 2016-05-04 MED FILL — CIPROFLOXACIN HCL 500 MG TA: 500 | 7 days supply | Qty: 14 | Fill #0

## 2016-05-04 NOTE — Discharge Instructions (Signed)
Abdominal Pain, Adult °Many things can cause abdominal pain. Usually, abdominal pain is not caused by a disease and will improve without treatment. It can often be observed and treated at home. Your health care provider will do a physical exam and possibly order blood tests and X-rays to help determine the seriousness of your pain. However, in many cases, more time must pass before a clear cause of the pain can be found. Before that point, your health care provider may not know if you need more testing or further treatment. °HOME CARE INSTRUCTIONS °Monitor your abdominal pain for any changes. The following actions may help to alleviate any discomfort you are experiencing: °· Only take over-the-counter or prescription medicines as directed by your health care provider. °· Do not take laxatives unless directed to do so by your health care provider. °· Try a clear liquid diet (broth, tea, or water) as directed by your health care provider. Slowly move to a bland diet as tolerated. °SEEK MEDICAL CARE IF: °· You have unexplained abdominal pain. °· You have abdominal pain associated with nausea or diarrhea. °· You have pain when you urinate or have a bowel movement. °· You experience abdominal pain that wakes you in the night. °· You have abdominal pain that is worsened or improved by eating food. °· You have abdominal pain that is worsened with eating fatty foods. °· You have a fever. °SEEK IMMEDIATE MEDICAL CARE IF: °· Your pain does not go away within 2 hours. °· You keep throwing up (vomiting). °· Your pain is felt only in portions of the abdomen, such as the right side or the left lower portion of the abdomen. °· You pass bloody or black tarry stools. °MAKE SURE YOU: °· Understand these instructions. °· Will watch your condition. °· Will get help right away if you are not doing well or get worse. °  °This information is not intended to replace advice given to you by your health care provider. Make sure you discuss  any questions you have with your health care provider. °  °Document Released: 08/24/2005 Document Revised: 08/05/2015 Document Reviewed: 07/24/2013 °Elsevier Interactive Patient Education ©2016 Elsevier Inc. ° °Bacterial Vaginosis °Bacterial vaginosis is a vaginal infection that occurs when the normal balance of bacteria in the vagina is disrupted. It results from an overgrowth of certain bacteria. This is the most common vaginal infection in women of childbearing age. Treatment is important to prevent complications, especially in pregnant women, as it can cause a premature delivery. °CAUSES  °Bacterial vaginosis is caused by an increase in harmful bacteria that are normally present in smaller amounts in the vagina. Several different kinds of bacteria can cause bacterial vaginosis. However, the reason that the condition develops is not fully understood. °RISK FACTORS °Certain activities or behaviors can put you at an increased risk of developing bacterial vaginosis, including: °· Having a new sex partner or multiple sex partners. °· Douching. °· Using an intrauterine device (IUD) for contraception. °Women do not get bacterial vaginosis from toilet seats, bedding, swimming pools, or contact with objects around them. °SIGNS AND SYMPTOMS  °Some women with bacterial vaginosis have no signs or symptoms. Common symptoms include: °· Grey vaginal discharge. °· A fishlike odor with discharge, especially after sexual intercourse. °· Itching or burning of the vagina and vulva. °· Burning or pain with urination. °DIAGNOSIS  °Your health care provider will take a medical history and examine the vagina for signs of bacterial vaginosis. A sample of vaginal fluid may   be taken. Your health care provider will look at this sample under a microscope to check for bacteria and abnormal cells. A vaginal pH test may also be done.  °TREATMENT  °Bacterial vaginosis may be treated with antibiotic medicines. These may be given in the form of a  pill or a vaginal cream. A second round of antibiotics may be prescribed if the condition comes back after treatment. Because bacterial vaginosis increases your risk for sexually transmitted diseases, getting treated can help reduce your risk for chlamydia, gonorrhea, HIV, and herpes. °HOME CARE INSTRUCTIONS  °· Only take over-the-counter or prescription medicines as directed by your health care provider. °· If antibiotic medicine was prescribed, take it as directed. Make sure you finish it even if you start to feel better. °· Tell all sexual partners that you have a vaginal infection. They should see their health care provider and be treated if they have problems, such as a mild rash or itching. °· During treatment, it is important that you follow these instructions: °¨ Avoid sexual activity or use condoms correctly. °¨ Do not douche. °¨ Avoid alcohol as directed by your health care provider. °¨ Avoid breastfeeding as directed by your health care provider. °SEEK MEDICAL CARE IF:  °· Your symptoms are not improving after 3 days of treatment. °· You have increased discharge or pain. °· You have a fever. °MAKE SURE YOU:  °· Understand these instructions. °· Will watch your condition. °· Will get help right away if you are not doing well or get worse. °FOR MORE INFORMATION  °Centers for Disease Control and Prevention, Division of STD Prevention: www.cdc.gov/std °American Sexual Health Association (ASHA): www.ashastd.org  °  °This information is not intended to replace advice given to you by your health care provider. Make sure you discuss any questions you have with your health care provider. °  °Document Released: 11/14/2005 Document Revised: 12/05/2014 Document Reviewed: 06/26/2013 °Elsevier Interactive Patient Education ©2016 Elsevier Inc. ° ° °

## 2016-06-29 ENCOUNTER — Encounter: Payer: Self-pay | Admitting: Family

## 2016-06-29 ENCOUNTER — Ambulatory Visit (INDEPENDENT_AMBULATORY_CARE_PROVIDER_SITE_OTHER): Payer: Managed Care, Other (non HMO) | Admitting: Family

## 2016-06-29 VITALS — BP 118/80 | HR 63 | Temp 98.0°F | Resp 16 | Ht 69.0 in | Wt 255.6 lb

## 2016-06-29 DIAGNOSIS — B029 Zoster without complications: Secondary | ICD-10-CM | POA: Diagnosis not present

## 2016-06-29 NOTE — Patient Instructions (Signed)
Please let us know if pain worsens or if pain does not continue to improve.  Shingles Shingles, which is also known as herpes zoster, is an infection that causes a painful skin rash and fluid-filled blisters. Shingles is not related to genital herpes, which is a sexually transmitted infection.   Shingles only develops in people who:  Have had chickenpox.  Have received the chickenpox vaccine. (This is rare.) CAUSES Shingles is caused by varicella-zoster virus (VZV). This is the same virus that causes chickenpox. After exposure to VZV, the virus stays in the body in an inactive (dormant) state. Shingles develops if the virus reactivates. This can happen many years after the initial exposure to VZV. It is not known what causes this virus to reactivate. RISK FACTORS People who have had chickenpox or received the chickenpox vaccine are at risk for shingles. Infection is more common in people who:  Are older than age 56.  Have a weakened defense (immune) system, such as those with HIV, AIDS, or cancer.  Are taking medicines that weaken the immune system, such as transplant medicines.  Are under great stress. SYMPTOMS Early symptoms of this condition include itching, tingling, and pain in an area on your skin. Pain may be described as burning, stabbing, or throbbing. A few days or weeks after symptoms start, a painful red rash appears, usually on one side of the body in a bandlike or beltlike pattern. The rash eventually turns into fluid-filled blisters that break open, scab over, and dry up in about 2-3 weeks. At any time during the infection, you may also develop:  A fever.  Chills.  A headache.  An upset stomach. DIAGNOSIS This condition is diagnosed with a skin exam. Sometimes, skin or fluid samples are taken from the blisters before a diagnosis is made. These samples are examined under a microscope or sent to a lab for testing. TREATMENT There is no specific cure for this  condition. Your health care provider will probably prescribe medicines to help you manage pain, recover more quickly, and avoid long-term problems. Medicines may include:  Antiviral drugs.  Anti-inflammatory drugs.  Pain medicines. If the area involved is on your face, you may be referred to a specialist, such as an eye doctor (ophthalmologist) or an ear, nose, and throat (ENT) doctor to help you avoid eye problems, chronic pain, or disability. HOME CARE INSTRUCTIONS Medicines  Take medicines only as directed by your health care provider.  Apply an anti-itch or numbing cream to the affected area as directed by your health care provider. Blister and Rash Care  Take a cool bath or apply cool compresses to the area of the rash or blisters as directed by your health care provider. This may help with pain and itching.  Keep your rash covered with a loose bandage (dressing). Wear loose-fitting clothing to help ease the pain of material rubbing against the rash.  Keep your rash and blisters clean with mild soap and cool water or as directed by your health care provider.  Check your rash every day for signs of infection. These include redness, swelling, and pain that lasts or increases.  Do not pick your blisters.  Do not scratch your rash. General Instructions  Rest as directed by your health care provider.  Keep all follow-up visits as directed by your health care provider. This is important.  Until your blisters scab over, your infection can cause chickenpox in people who have never had it or been vaccinated against it. To prevent  this from happening, avoid contact with other people, especially:  Babies.  Pregnant women.  Children who have eczema.  Elderly people who have transplants.  People who have chronic illnesses, such as leukemia or AIDS. SEEK MEDICAL CARE IF:  Your pain is not relieved with prescribed medicines.  Your pain does not get better after the rash  heals.  Your rash looks infected. Signs of infection include redness, swelling, and pain that lasts or increases. SEEK IMMEDIATE MEDICAL CARE IF:  The rash is on your face or nose.  You have facial pain, pain around your eye area, or loss of feeling on one side of your face.  You have ear pain or you have ringing in your ear.  You have loss of taste.  Your condition gets worse.   This information is not intended to replace advice given to you by your health care provider. Make sure you discuss any questions you have with your health care provider.   Document Released: 11/14/2005 Document Revised: 12/05/2014 Document Reviewed: 09/25/2014 Elsevier Interactive Patient Education [employer].

## 2016-06-29 NOTE — Progress Notes (Signed)
Pre visit review using our clinic review tool, if applicable. No additional management support is needed unless otherwise documented below in the visit note. 

## 2016-06-29 NOTE — Progress Notes (Signed)
Subjective:    Patient ID: Joan Harris, female    DOB: 1986-06-08, 30 y.o.   MRN: [id number]  HPI  Joan Harris is a 30 yr old female who presents today with chief complaint of painful/blistered rash under right breast. First noticed rash 10 days ago.  Has been applying multiple OTC ointments without improvement. Area is sore, uncomfortable to put on a bra.    Review of Systems    see HPI  Past Medical History:  Diagnosis Date  . Back pain   . Obesity   . Persistent headaches      Social History   Social History  . Marital status: Single    Spouse name: N/A  . Number of children: 1  . Years of education: College   Occupational History  .  [place]    [employer]   Social History Main Topics  . Smoking status: Former Smoker    Types: Cigars  . Smokeless tobacco: Never Used  . Alcohol use No  . Drug use: No  . Sexual activity: Yes    Birth control/ protection: Pill   Other Topics Concern  . Not on file   Social History Narrative   Marital Status: Single   Children:  Daughter Teacher, early years/pre)   Pets: None   Living Situation: Lives with her daughter   Occupation: Clinical biochemist Geographical information systems officer)   Education: Fish farm manager  Nurse, adult)    Tobacco Use:  She used to smoke Black & Mild 4 x per week. She quit 3 years ago.     Alcohol Use:  None   Drug Use:  None   Diet:  Regular   Exercise:  None   Hobbies: Makeup, Traveling    Caffeine Use: 1-2 cups daily    Past Surgical History:  Procedure Laterality Date  . BARTHOLIN GLAND CYST EXCISION      Family History  Problem Relation Age of Onset  . Heart disease Mother   . Hypertension Mother   . Hyperlipidemia Mother   . Hypertension Sister   . Asthma Daughter   . Breast cancer Maternal Grandmother     No Known Allergies  Current Outpatient Prescriptions on File Prior to Visit  Medication Sig Dispense Refill  . cetirizine (ZYRTEC) 10 MG tablet Take 1 tablet by mouth daily.      Marland Kitchen etonogestrel (NEXPLANON) 68 MG IMPL implant 68 mg.     No current facility-administered medications on file prior to visit.     BP 118/80   Pulse 63   Temp 98 F (36.7 C) (Oral)   Resp 16   Ht  (1.753 m)   Wt 255 lb 9.6 oz (115.9 kg)   LMP 06/06/2016   SpO2 98% Comment: room air  BMI 37.75 kg/m    Objective:   Physical Exam  Constitutional: She is oriented to person, place, and time. She appears well-developed and well-nourished. No distress.  Neurological: She is alert and oriented to person, place, and time.  Skin: Skin is warm and dry.     Small patch of shallow ulcerated lesions noted beneath the right breast.    Psychiatric: She has a normal mood and affect. Her behavior is normal. Judgment and thought content normal.          Assessment & Plan:  Herpes Zoster- outside of window for rx with valtrex.  No sign of bacterial infection, appears to be healing appropriately. Advised pt to call if pain worsens or does not  improve in the next few weeks. Pt verbalizes understanding.

## 2016-07-25 ENCOUNTER — Encounter: Payer: Self-pay | Admitting: Family Medicine

## 2016-07-25 ENCOUNTER — Ambulatory Visit (INDEPENDENT_AMBULATORY_CARE_PROVIDER_SITE_OTHER): Payer: Managed Care, Other (non HMO) | Admitting: Family Medicine

## 2016-07-25 DIAGNOSIS — R200 Anesthesia of skin: Secondary | ICD-10-CM

## 2016-07-25 DIAGNOSIS — R202 Paresthesia of skin: Secondary | ICD-10-CM | POA: Insufficient documentation

## 2016-07-25 LAB — EKG 12-LEAD

## 2016-07-25 MED ORDER — PHENTERMINE HCL 37.5 MG PO TABS: 37.5000 mg | ORAL_TABLET | Freq: Every day | ORAL | 0 refills | Status: DC

## 2016-07-25 MED FILL — PHENTERMINE 37.5 MG TABLET: 37.5 | 30 days supply | Qty: 30 | Fill #0

## 2016-07-25 NOTE — Assessment & Plan Note (Signed)
adipex Discussed diet and exercise rto 1 month or sooner prn

## 2016-07-25 NOTE — Progress Notes (Signed)
Patient ID: Joan Harris, female    DOB: [birth year]  Age: 30 y.o. MRN: [id number]    Subjective:  Subjective  HPI Joan Hornsshley Savitt presents for numbness L upper arm-- with no known injury.  She wonders if it is fron nexplanon.    She is also struggling with weight and exercise.  She is exercising --- walking 2x a week. She is watching what she eats.      Review of Systems  Constitutional: Negative for activity change, appetite change, fatigue and unexpected weight change.  Respiratory: Negative for cough and shortness of breath.   Cardiovascular: Negative for chest pain and palpitations.  Neurological: Positive for numbness. Negative for tremors and weakness.  Psychiatric/Behavioral: Negative for behavioral problems and dysphoric mood. The patient is not nervous/anxious.     History Past Medical History:  Diagnosis Date  . Back pain   . Obesity   . Persistent headaches     She has a past surgical history that includes Bartholin gland cyst excision.   Her family history includes Asthma in her daughter; Breast cancer in her maternal grandmother; Heart disease in her mother; Hyperlipidemia in her mother; Hypertension in her mother and sister.She reports that she has quit smoking. Her smoking use included Cigars. She has never used smokeless tobacco. She reports that she does not drink alcohol or use drugs.  Current Outpatient Prescriptions on File Prior to Visit  Medication Sig Dispense Refill  . cetirizine (ZYRTEC) 10 MG tablet Take 1 tablet by mouth daily.    Marland Kitchen. etonogestrel (NEXPLANON) 68 MG IMPL implant 68 mg.     No current facility-administered medications on file prior to visit.      Objective:  Objective  Physical Exam  Constitutional: She is oriented to person, place, and time. She appears well-developed and well-nourished.  HENT:  Head: Normocephalic and atraumatic.  Eyes: Conjunctivae and EOM are normal.  Neck: Normal range of motion. Neck supple. No JVD  present. Carotid bruit is not present. No thyromegaly present.  Cardiovascular: Normal rate, regular rhythm and normal heart sounds.   No murmur heard. Pulmonary/Chest: Effort normal and breath sounds normal. No respiratory distress. She has no wheezes. She has no rales. She exhibits no tenderness.  Musculoskeletal: She exhibits no edema.  Neurological: She is alert and oriented to person, place, and time.  Numbness in L upper ex-- elbow to hand  Psychiatric: She has a normal mood and affect. Her behavior is normal. Judgment and thought content normal.  Nursing note and vitals reviewed.  BP 117/83 (BP Location: Right Arm, Patient Position: Sitting, Cuff Size: Large)   Pulse 80   Temp 98.1 F (36.7 C) (Oral)   Ht 5\' 9"  (1.753 m)   Wt 258 lb 12.8 oz (117.4 kg)   LMP 06/06/2016   BMI 38.22 kg/m  Wt Readings from Last 3 Encounters:  07/25/16 258 lb 12.8 oz (117.4 kg)  06/29/16 255 lb 9.6 oz (115.9 kg)  05/03/16 254 lb (115.2 kg)     Lab Results  Component Value Date   WBC 8.5 05/03/2016   HGB 12.0 05/03/2016   HCT 36.1 05/03/2016   PLT 376 05/03/2016   GLUCOSE 91 05/03/2016   CHOL 153 08/17/2015   TRIG 78.0 08/17/2015   HDL 46.30 08/17/2015   LDLCALC 91 08/17/2015   ALT 29 05/03/2016   AST 30 05/03/2016   NA 139 05/03/2016   K 3.4 (L) 05/03/2016   CL 104 05/03/2016   CREATININE 0.88 05/03/2016   BUN  10 05/03/2016   CO2 27 05/03/2016   TSH 0.85 08/17/2015   HGBA1C 6.0 (H) 07/22/2014    Ct Abdomen Pelvis W Contrast  Result Date: 05/03/2016 CLINICAL DATA:  30 year old female with right lower quadrant abdominal pain and nausea. EXAM: CT ABDOMEN AND PELVIS WITH CONTRAST TECHNIQUE: Multidetector CT imaging of the abdomen and pelvis was performed using the standard protocol following bolus administration of intravenous contrast. CONTRAST:  ISOVUE-300 IOPAMIDOL (ISOVUE-300) INJECTION 61% COMPARISON:  CT dated 02/05/2013 FINDINGS: The visualized lung bases are clear. No  intra-abdominal free air. Trace free fluid within the pelvis. All the liver, gallbladder, pancreas, spleen, adrenal glands is trauma kidneys:, visualized ureters, and urinary bladder appear unremarkable. The uterus is anteverted and grossly unremarkable. There is a 2.5 cm right ovarian dominant follicle/cyst. Ultrasound may provide better evaluation of the pelvic structures. There is moderate stool throughout the colon. No evidence of bowel obstruction or active inflammation. Normal appendix. The abdominal aorta and IVC appear unremarkable. No portal venous gas identified. There is no adenopathy. There is a midline vertical anterior pelvic wall incisional scar. A small fat containing umbilical hernia noted. The abdominal wall soft tissues are otherwise unremarkable. The osseous structures are intact. IMPRESSION: A 2.5 cm dominant right ovarian cyst. Ultrasound may provide better evaluation of the pelvic structures. No other acute intra-abdominal or pelvic pathology identified. Electronically Signed   By: Elgie Collard M.D.   On: 05/03/2016 22:26     Assessment & Plan:  Plan  I am having Ms. Coglianese start on phentermine. I am also having her maintain her cetirizine and etonogestrel.  Meds ordered this encounter  Medications  . phentermine (ADIPEX-P) 37.5 MG tablet    Sig: Take 1 tablet (37.5 mg total) by mouth daily before breakfast.    Dispense:  30 tablet    Refill:  0    Problem List Items Addressed This Visit      Unprioritized   Numbness and tingling in left arm    Started with nexplanon She will discuss with gyn      Obesity, morbid (HCC) - Primary    adipex Discussed diet and exercise rto 1 month or sooner prn      Relevant Medications   phentermine (ADIPEX-P) 37.5 MG tablet   Other Relevant Orders   EKG 12-Lead (Completed)    Other Visit Diagnoses   None.     Follow-up: Return in about 4 weeks (around 08/22/2016).  Donato Schultz, DO

## 2016-07-25 NOTE — Patient Instructions (Signed)
Obesity Obesity is defined as having too much total body fat and a body mass index (BMI) of 30 or more. BMI is an estimate of body fat and is calculated from your height and weight. BMI is typically calculated by your health care provider during regular wellness visits. Obesity happens when you consume more calories than you can burn by exercising or performing daily physical tasks. Prolonged obesity can cause major illnesses or emergencies, such as:  Stroke.  Heart disease.  Diabetes.  Cancer.  Arthritis.  High blood pressure (hypertension).  High cholesterol.  Sleep apnea.  Erectile dysfunction.  Infertility problems. CAUSES   Regularly eating unhealthy foods.  Physical inactivity.  Certain disorders, such as an underactive thyroid (hypothyroidism), Cushing's syndrome, and polycystic ovarian syndrome.  Certain medicines, such as steroids, some depression medicines, and antipsychotics.  Genetics.  Lack of sleep. DIAGNOSIS A health care provider can diagnose obesity after calculating your BMI. Obesity will be diagnosed if your BMI is 30 or higher. There are other methods of measuring obesity levels. Some other methods include measuring your skinfold thickness, your waist circumference, and comparing your hip circumference to your waist circumference. TREATMENT  A healthy treatment program includes some or all of the following:  Long-term dietary changes.  Exercise and physical activity.  Behavioral and lifestyle changes.  Medicine only under the supervision of your health care provider. Medicines may help, but only if they are used with diet and exercise programs. If your BMI is 40 or higher, your health care provider may recommend specialized surgery or programs to help with weight loss. An unhealthy treatment program includes:  Fasting.  Fad diets.  Supplements and drugs. These choices do not succeed in long-term weight control. HOME CARE  INSTRUCTIONS  Exercise and perform physical activity as directed by your health care provider. To increase physical activity, try the following:  Use stairs instead of elevators.  Park farther away from store entrances.  Garden, bike, or walk instead of watching television or using the computer.  Eat healthy, low-calorie foods and drinks on a regular basis. Eat more fruits and vegetables. Use low-calorie cookbooks or take healthy cooking classes.  Limit fast food, sweets, and processed snack foods.  Eat smaller portions.  Keep a daily journal of everything you eat. There are many free websites to help you with this. It may be helpful to measure your foods so you can determine if you are eating the correct portion sizes.  Avoid drinking alcohol. Drink more water and drinks without calories.  Take vitamins and supplements only as recommended by your health care provider.  Weight-loss support groups, registered dietitians, counselors, and stress reduction education can also be very helpful. SEEK IMMEDIATE MEDICAL CARE IF:  You have chest pain or tightness.  You have trouble breathing or feel short of breath.  You have weakness or leg numbness.  You feel confused or have trouble talking.  You have sudden changes in your vision.   This information is not intended to replace advice given to you by your health care provider. Make sure you discuss any questions you have with your health care provider.   Document Released: 12/22/2004 Document Revised: 12/05/2014 Document Reviewed: 12/21/2011 Elsevier Interactive Patient Education 2016 [employer] Inc[employer]  

## 2016-07-25 NOTE — Assessment & Plan Note (Signed)
Started with nexplanon She will discuss with gyn

## 2016-07-25 NOTE — Progress Notes (Signed)
Pre visit review using our clinic review tool, if applicable. No additional management support is needed unless otherwise documented below in the visit note. 

## 2016-08-23 ENCOUNTER — Ambulatory Visit (INDEPENDENT_AMBULATORY_CARE_PROVIDER_SITE_OTHER): Payer: Managed Care, Other (non HMO) | Admitting: Family Medicine

## 2016-08-23 MED ORDER — PHENTERMINE HCL 37.5 MG PO TABS: 37.5000 mg | ORAL_TABLET | Freq: Every day | ORAL | 0 refills | Status: DC

## 2016-08-23 NOTE — Patient Instructions (Signed)

## 2016-08-23 NOTE — Progress Notes (Signed)
Patient ID: Joan Harris, female    DOB: May 29, 1986  Age: 30 y.o. MRN: [id number]    Subjective:  Subjective  HPI Joan Harris presents for weight check. -she is doing well with diet and exercise. No complaints.  Review of Systems  Constitutional: Negative for appetite change, diaphoresis, fatigue and unexpected weight change.  Eyes: Negative for pain, redness and visual disturbance.  Respiratory: Negative for cough, chest tightness, shortness of breath and wheezing.   Cardiovascular: Negative for chest pain, palpitations and leg swelling.  Endocrine: Negative for cold intolerance, heat intolerance, polydipsia, polyphagia and polyuria.  Genitourinary: Negative for difficulty urinating, dysuria and frequency.  Neurological: Negative for dizziness, light-headedness, numbness and headaches.    History Past Medical History:  Diagnosis Date  . Back pain   . Obesity   . Persistent headaches     She has a past surgical history that includes Bartholin gland cyst excision.   Her family history includes Asthma in her daughter; Breast cancer in her maternal grandmother; Heart disease in her mother; Hyperlipidemia in her mother; Hypertension in her mother and sister.She reports that she has quit smoking. Her smoking use included Cigars. She has never used smokeless tobacco. She reports that she does not drink alcohol or use drugs.  Current Outpatient Prescriptions on File Prior to Visit  Medication Sig Dispense Refill  . cetirizine (ZYRTEC) 10 MG tablet Take 1 tablet by mouth daily.    Marland Kitchen. etonogestrel (NEXPLANON) 68 MG IMPL implant 68 mg.     No current facility-administered medications on file prior to visit.      Objective:  Objective  Physical Exam  Constitutional: She is oriented to person, place, and time. She appears well-developed and well-nourished. No distress.  HENT:  Right Ear: External ear normal.  Left Ear: External ear normal.  Nose: Nose normal.  Mouth/Throat:  Oropharynx is clear and moist.  Eyes: EOM are normal. Pupils are equal, round, and reactive to light.  Neck: Normal range of motion. Neck supple.  Cardiovascular: Normal rate, regular rhythm and normal heart sounds.   No murmur heard. Pulmonary/Chest: Effort normal and breath sounds normal. No respiratory distress. She has no wheezes. She has no rales. She exhibits no tenderness.  Neurological: She is alert and oriented to person, place, and time.  Psychiatric: She has a normal mood and affect. Her behavior is normal. Judgment and thought content normal.  Nursing note and vitals reviewed.  BP 117/78 (BP Location: Right Arm, Patient Position: Sitting, Cuff Size: Normal)   Pulse 79   Temp 97.9 F (36.6 C) (Oral)   Wt 247 lb (112 kg)   LMP 08/01/2016   SpO2 100%   BMI 36.48 kg/m  Wt Readings from Last 3 Encounters:  08/23/16 247 lb (112 kg)  07/25/16 258 lb 12.8 oz (117.4 kg)  06/29/16 255 lb 9.6 oz (115.9 kg)     Lab Results  Component Value Date   WBC 8.5 05/03/2016   HGB 12.0 05/03/2016   HCT 36.1 05/03/2016   PLT 376 05/03/2016   GLUCOSE 91 05/03/2016   CHOL 153 08/17/2015   TRIG 78.0 08/17/2015   HDL 46.30 08/17/2015   LDLCALC 91 08/17/2015   ALT 29 05/03/2016   AST 30 05/03/2016   NA 139 05/03/2016   K 3.4 (L) 05/03/2016   CL 104 05/03/2016   CREATININE 0.88 05/03/2016   BUN 10 05/03/2016   CO2 27 05/03/2016   TSH 0.85 08/17/2015   HGBA1C 6.0 (H) 07/22/2014  Ct Abdomen Pelvis W Contrast  Result Date: 05/03/2016 CLINICAL DATA:  30 year old female with right lower quadrant abdominal pain and nausea. EXAM: CT ABDOMEN AND PELVIS WITH CONTRAST TECHNIQUE: Multidetector CT imaging of the abdomen and pelvis was performed using the standard protocol following bolus administration of intravenous contrast. CONTRAST:  ISOVUE-300 IOPAMIDOL (ISOVUE-300) INJECTION 61% COMPARISON:  CT dated 02/05/2013 FINDINGS: The visualized lung bases are clear. No intra-abdominal free  air. Trace free fluid within the pelvis. All the liver, gallbladder, pancreas, spleen, adrenal glands is trauma kidneys:, visualized ureters, and urinary bladder appear unremarkable. The uterus is anteverted and grossly unremarkable. There is a 2.5 cm right ovarian dominant follicle/cyst. Ultrasound may provide better evaluation of the pelvic structures. There is moderate stool throughout the colon. No evidence of bowel obstruction or active inflammation. Normal appendix. The abdominal aorta and IVC appear unremarkable. No portal venous gas identified. There is no adenopathy. There is a midline vertical anterior pelvic wall incisional scar. A small fat containing umbilical hernia noted. The abdominal wall soft tissues are otherwise unremarkable. The osseous structures are intact. IMPRESSION: A 2.5 cm dominant right ovarian cyst. Ultrasound may provide better evaluation of the pelvic structures. No other acute intra-abdominal or pelvic pathology identified. Electronically Signed   By: Elgie Collard M.D.   On: 05/03/2016 22:26     Assessment & Plan:  Plan  I am having Ms. Crisco maintain her cetirizine, etonogestrel, and phentermine.  Meds ordered this encounter  Medications  . phentermine (ADIPEX-P) 37.5 MG tablet    Sig: Take 1 tablet (37.5 mg total) by mouth daily before breakfast.    Dispense:  30 tablet    Refill:  0    Problem List Items Addressed This Visit      Unprioritized   Obesity, morbid (HCC)    con't diet and exercise      Relevant Medications   phentermine (ADIPEX-P) 37.5 MG tablet    Other Visit Diagnoses   None.     Follow-up: Return in about 4 weeks (around 09/20/2016) for weight check .  Donato Schultz, DO

## 2016-08-23 NOTE — Progress Notes (Signed)
Pre visit review using our clinic review tool, if applicable. No additional management support is needed unless otherwise documented below in the visit note. 

## 2016-08-24 ENCOUNTER — Encounter: Payer: Self-pay | Admitting: Family Medicine

## 2016-08-24 MED FILL — PHENTERMINE 37.5 MG TABLET: 37.5 | 30 days supply | Qty: 30 | Fill #0

## 2016-08-24 NOTE — Assessment & Plan Note (Signed)
con't diet and exercise  

## 2016-09-20 ENCOUNTER — Ambulatory Visit (INDEPENDENT_AMBULATORY_CARE_PROVIDER_SITE_OTHER): Payer: Managed Care, Other (non HMO) | Admitting: Family Medicine

## 2016-09-20 MED ORDER — PHENTERMINE HCL 37.5 MG PO TABS: 37.5000 mg | ORAL_TABLET | Freq: Every day | ORAL | 0 refills | Status: DC

## 2016-09-20 NOTE — Progress Notes (Signed)
Pre visit review using our clinic review tool, if applicable. No additional management support is needed unless otherwise documented below in the visit note. 

## 2016-09-20 NOTE — Patient Instructions (Signed)

## 2016-09-20 NOTE — Progress Notes (Signed)
Patient ID: Joan Harris, female    DOB: Feb 21, 1986  Age: 30 y.o. MRN: [id number]    Subjective:  Subjective  HPI Perian Tedder presents for f/u weight -- she did struggle some with exercise and weight watchers --- weight has been flucuating 4-5 lbs.     Review of Systems  Constitutional: Negative for activity change, appetite change, fatigue and unexpected weight change.  Respiratory: Negative for cough and shortness of breath.   Cardiovascular: Negative for chest pain and palpitations.  Psychiatric/Behavioral: Negative for behavioral problems and dysphoric mood. The patient is not nervous/anxious.     History Past Medical History:  Diagnosis Date  . Back pain   . Obesity   . Persistent headaches     She has a past surgical history that includes Bartholin gland cyst excision.   Her family history includes Asthma in her daughter; Breast cancer in her maternal grandmother; Heart disease in her mother; Hyperlipidemia in her mother; Hypertension in her mother and sister.She reports that she has quit smoking. Her smoking use included Cigars. She has never used smokeless tobacco. She reports that she does not drink alcohol or use drugs.  Current Outpatient Prescriptions on File Prior to Visit  Medication Sig Dispense Refill  . cetirizine (ZYRTEC) 10 MG tablet Take 1 tablet by mouth daily.    Marland Kitchen etonogestrel (NEXPLANON) 68 MG IMPL implant 68 mg.     No current facility-administered medications on file prior to visit.      Objective:  Objective  Physical Exam  Constitutional: She is oriented to person, place, and time. She appears well-developed and well-nourished.  HENT:  Head: Normocephalic and atraumatic.  Eyes: Conjunctivae and EOM are normal.  Neck: Normal range of motion. Neck supple. No JVD present. Carotid bruit is not present. No thyromegaly present.  Cardiovascular: Normal rate, regular rhythm and normal heart sounds.   No murmur heard. Pulmonary/Chest: Effort  normal and breath sounds normal. No respiratory distress. She has no wheezes. She has no rales. She exhibits no tenderness.  Musculoskeletal: She exhibits no edema.  Neurological: She is alert and oriented to person, place, and time.  Psychiatric: She has a normal mood and affect.  Nursing note and vitals reviewed.  BP 115/83 (BP Location: Right Arm, Patient Position: Sitting, Cuff Size: Normal)   Pulse 64   Temp 98.3 F (36.8 C) (Oral)   Ht 5\' 9"  (1.753 m)   Wt 248 lb 6.4 oz (112.7 kg)   LMP 08/01/2016   BMI 36.68 kg/m  Wt Readings from Last 3 Encounters:  09/20/16 248 lb 6.4 oz (112.7 kg)  08/23/16 247 lb (112 kg)  07/25/16 258 lb 12.8 oz (117.4 kg)     Lab Results  Component Value Date   WBC 8.5 05/03/2016   HGB 12.0 05/03/2016   HCT 36.1 05/03/2016   PLT 376 05/03/2016   GLUCOSE 91 05/03/2016   CHOL 153 08/17/2015   TRIG 78.0 08/17/2015   HDL 46.30 08/17/2015   LDLCALC 91 08/17/2015   ALT 29 05/03/2016   AST 30 05/03/2016   NA 139 05/03/2016   K 3.4 (L) 05/03/2016   CL 104 05/03/2016   CREATININE 0.88 05/03/2016   BUN 10 05/03/2016   CO2 27 05/03/2016   TSH 0.85 08/17/2015   HGBA1C 6.0 (H) 07/22/2014    Ct Abdomen Pelvis W Contrast  Result Date: 05/03/2016 CLINICAL DATA:  30 year old female with right lower quadrant abdominal pain and nausea. EXAM: CT ABDOMEN AND PELVIS WITH CONTRAST TECHNIQUE: Multidetector  CT imaging of the abdomen and pelvis was performed using the standard protocol following bolus administration of intravenous contrast. CONTRAST:  100mL ISOVUE-300 IOPAMIDOL (ISOVUE-300) INJECTION 61% COMPARISON:  CT dated 02/05/2013 FINDINGS: The visualized lung bases are clear. No intra-abdominal free air. Trace free fluid within the pelvis. All the liver, gallbladder, pancreas, spleen, adrenal glands is trauma kidneys:, visualized ureters, and urinary bladder appear unremarkable. The uterus is anteverted and grossly unremarkable. There is a 2.5 cm right ovarian  dominant follicle/cyst. Ultrasound may provide better evaluation of the pelvic structures. There is moderate stool throughout the colon. No evidence of bowel obstruction or active inflammation. Normal appendix. The abdominal aorta and IVC appear unremarkable. No portal venous gas identified. There is no adenopathy. There is a midline vertical anterior pelvic wall incisional scar. A small fat containing umbilical hernia noted. The abdominal wall soft tissues are otherwise unremarkable. The osseous structures are intact. IMPRESSION: A 2.5 cm dominant right ovarian cyst. Ultrasound may provide better evaluation of the pelvic structures. No other acute intra-abdominal or pelvic pathology identified. Electronically Signed   By: Elgie CollardArash  Radparvar M.D.   On: 05/03/2016 22:26     Assessment & Plan:  Plan  I am having Ms. Truss maintain her cetirizine, etonogestrel, and phentermine.  Meds ordered this encounter  Medications  . phentermine (ADIPEX-P) 37.5 MG tablet    Sig: Take 1 tablet (37.5 mg total) by mouth daily before breakfast.    Dispense:  30 tablet    Refill:  0    Problem List Items Addressed This Visit    None    Visit Diagnoses    Morbid obesity due to excess calories (HCC)       Relevant Medications   phentermine (ADIPEX-P) 37.5 MG tablet    pt will try to exercise more and watch her diet-- cont WW  Follow-up: Return in about 4 weeks (around 10/18/2016), or weight check.  Donato SchultzYvonne R Lowne Chase, DO

## 2016-09-21 ENCOUNTER — Encounter: Payer: Self-pay | Admitting: Family Medicine

## 2016-09-22 MED FILL — PHENTERMINE 37.5 MG TABLET: 37.5 | 30 days supply | Qty: 30 | Fill #0

## 2017-02-02 ENCOUNTER — Encounter: Payer: Self-pay | Admitting: Family Medicine

## 2017-02-02 ENCOUNTER — Ambulatory Visit (INDEPENDENT_AMBULATORY_CARE_PROVIDER_SITE_OTHER): Payer: Managed Care, Other (non HMO) | Admitting: Family Medicine

## 2017-02-02 ENCOUNTER — Ambulatory Visit (HOSPITAL_BASED_OUTPATIENT_CLINIC_OR_DEPARTMENT_OTHER)
Admission: RE | Admit: 2017-02-02 | Discharge: 2017-02-02 | Disposition: A | Discharge status: Home / Self Care | Payer: Managed Care, Other (non HMO) | Source: Ambulatory Visit | Attending: Family Medicine | Admitting: Family Medicine

## 2017-02-02 VITALS — BP 110/78 | HR 56 | Temp 97.4°F | Resp 16 | Ht 69.0 in | Wt 252.4 lb

## 2017-02-02 DIAGNOSIS — M542 Cervicalgia: Secondary | ICD-10-CM | POA: Diagnosis not present

## 2017-02-02 DIAGNOSIS — M5489 Other dorsalgia: Secondary | ICD-10-CM

## 2017-02-02 NOTE — Progress Notes (Signed)
Pre visit review using our clinic review tool, if applicable. No additional management support is needed unless otherwise documented below in the visit note. 

## 2017-02-02 NOTE — Progress Notes (Signed)
I acted as a Neurosurgeonscribe for Dr. Delman KittenLowne Kit Mollett, LPN      Subjective:    Patient ID: Joan Harris, female    DOB: 13-Aug-1986, 31 y.o.   MRN: [id number]  Chief Complaint  Patient presents with  . Back Pain  . Neck Pain    Back Pain  This is a new problem. The current episode started in the past 7 days. The problem occurs constantly. The problem has been gradually worsening since onset. The pain is present in the lumbar spine. The quality of the pain is described as stabbing. The pain is at a severity of 10/10. The pain is severe. The pain is the same all the time. Stiffness is present all day. Associated symptoms include numbness. Pertinent negatives include no chest pain, fever or headaches. She has tried muscle relaxant for the symptoms. The treatment provided no relief.  Neck Pain   This is a new problem. The current episode started in the past 7 days. The problem occurs constantly. The problem has been gradually worsening. Associated symptoms include numbness. Pertinent negatives include no chest pain, fever or headaches.    Patient is in today for c/o back pain on the right side in the middle. Patient report the pain started about 3 days ago, patient also report left arm and hand numbness, along with neck pain when bending head forward. Patient report she went to Urgent care yesterday and was treated with muscle relaxer.   Patient Care Team: Donato SchultzYvonne R Lowne Chase, DO as PCP - General (Family Medicine)   Past Medical History:  Diagnosis Date  . Back pain   . Obesity   . Persistent headaches     Past Surgical History:  Procedure Laterality Date  . BARTHOLIN GLAND CYST EXCISION      Family History  Problem Relation Age of Onset  . Heart disease Mother   . Hypertension Mother   . Hyperlipidemia Mother   . Hypertension Sister   . Asthma Daughter   . Breast cancer Maternal Grandmother     Social History   Social History  . Marital status: Single    Spouse name: N/A  .  Number of children: 1  . Years of education: College   Occupational History  .  [place]    [employer]   Social History Main Topics  . Smoking status: Former Smoker    Types: Cigars  . Smokeless tobacco: Never Used  . Alcohol use No  . Drug use: No  . Sexual activity: Yes    Birth control/ protection: Pill   Other Topics Concern  . 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 She quit 3 years ago.     Alcohol Use:  None   Drug Use:  None   Diet:  Regular   Exercise:  None   Hobbies: Makeup, Traveling    Caffeine Use: 1-2 cups daily    Outpatient Medications Prior to Visit  Medication Sig Dispense Refill  . cetirizine (ZYRTEC) 10 MG tablet Take 1 tablet by mouth daily.    Marland Kitchen. etonogestrel (NEXPLANON) 68 MG IMPL implant 68 mg.    . phentermine (ADIPEX-P) 37.5 MG tablet Take  1 tablet (37.5 mg total) by mouth daily before breakfast. (Patient not taking: Reported on 02/02/2017) 30 tablet 0   No facility-administered medications prior to visit.     No Known Allergies  Review of Systems  Constitutional: Negative for fever.  HENT: Negative for congestion.   Eyes: Negative for blurred vision.  Respiratory: Negative for cough.   Cardiovascular: Negative for chest pain and palpitations.  Gastrointestinal: Negative for vomiting.  Musculoskeletal: Positive for back pain and neck pain.  Skin: Negative for rash.  Neurological: Positive for numbness. Negative for loss of consciousness and headaches.       Objective:    Physical Exam  Constitutional: She is oriented to person, place, and time. She appears well-developed and well-nourished. No distress.  HENT:  Head: Normocephalic and atraumatic.    Eyes: Conjunctivae are normal. Pupils are equal, round, and reactive to light.  Neck: Normal range of motion. No thyromegaly present.  Cardiovascular: Normal rate and regular rhythm.   Pulmonary/Chest: Effort normal and breath sounds normal. She has no wheezes.  Abdominal: Soft. Bowel sounds are normal. There is no tenderness.  Musculoskeletal: Normal range of motion. She exhibits no edema or deformity.  Neurological: She is alert and oriented to person, place, and time.  Skin: Skin is warm and dry. She is not diaphoretic.  Psychiatric: She has a normal mood and affect.    BP 110/78 (BP Location: Right Arm, Patient Position: Sitting, Cuff Size: Normal)   Pulse (!) 56   Temp 97.4 F (36.3 C) (Oral)   Resp 16   Ht 5\' 9"  (1.753 m)   Wt 252 lb 6.4 oz (114.5 kg)   LMP 02/01/2017   SpO2 97%   BMI 37.27 kg/m  Wt Readings from Last 3 Encounters:  02/02/17 252 lb 6.4 oz (114.5 kg)  09/20/16 248 lb 6.4 oz (112.7 kg)  08/23/16 247 lb (112 kg)     Lab Results  Component Value Date   WBC 8.5 05/03/2016   HGB 12.0 05/03/2016   HCT 36.1 05/03/2016   PLT 376 05/03/2016   GLUCOSE 91 05/03/2016   CHOL 153 08/17/2015   TRIG 78.0 08/17/2015   HDL 46.30 08/17/2015   LDLCALC 91 08/17/2015   ALT 29 05/03/2016   AST 30 05/03/2016   NA 139 05/03/2016   K 3.4 (L) 05/03/2016   CL 104 05/03/2016   CREATININE 0.88 05/03/2016   BUN 10 05/03/2016   CO2 27 05/03/2016   TSH 0.85 08/17/2015   HGBA1C 6.0 (H) 07/22/2014    Lab Results  Component Value Date   TSH 0.85 08/17/2015   Lab Results  Component Value Date   WBC 8.5 05/03/2016   HGB 12.0 05/03/2016   HCT 36.1 05/03/2016   MCV 89.8 05/03/2016   PLT 376 05/03/2016   Lab Results  Component Value Date   NA 139 05/03/2016   K 3.4 (L) 05/03/2016   CO2 27 05/03/2016   GLUCOSE 91 05/03/2016   BUN 10 05/03/2016   CREATININE 0.88 05/03/2016   BILITOT 0.3 05/03/2016   ALKPHOS 61 05/03/2016   AST 30 05/03/2016   ALT 29 05/03/2016    PROT 7.5 05/03/2016   ALBUMIN 3.9 05/03/2016   CALCIUM 9.2 05/03/2016   ANIONGAP 8 05/03/2016   GFR 93.92 08/17/2015   Lab Results  Component Value Date   CHOL 153 08/17/2015   Lab Results  Component Value Date   HDL 46.30 08/17/2015   Lab Results  Component Value Date  LDLCALC 91 08/17/2015   Lab Results  Component Value Date   TRIG 78.0 08/17/2015   Lab Results  Component Value Date   CHOLHDL 3 08/17/2015   Lab Results  Component Value Date   HGBA1C 6.0 (H) 07/22/2014       Assessment & Plan:   Problem List Items Addressed This Visit      Unprioritized   Back pain - Primary   Relevant Orders   DG Cervical Spine Complete (Completed)   DG Thoracic Spine 2 View (Completed)   Ambulatory referral to Sports Medicine    Other Visit Diagnoses    Neck pain       Relevant Orders   DG Cervical Spine Complete (Completed)   DG Thoracic Spine 2 View (Completed)   Ambulatory referral to Sports Medicine      I am having Ms. Aoun maintain her cetirizine, etonogestrel, and phentermine.  No orders of the defined types were placed in this encounter.   CMA served as Neurosurgeon during this visit. History, Physical and Plan performed by medical provider. Documentation and orders reviewed and attested to.  Donato Schultz, DO  Patient ID: Joan Harris, female   DOB: [birth year], 31 y.o.   MRN: [id number]

## 2017-02-02 NOTE — Patient Instructions (Signed)
Back Pain, Adult  Back pain is very common. The pain often gets better over time. The cause of back pain is usually not dangerous. Most people can learn to manage their back pain on their own.  Follow these instructions at home:  Watch your back pain for any changes. The following actions may help to lessen any pain you are feeling:  · Stay active. Start with short walks on flat ground if you can. Try to walk farther each day.  · Exercise regularly as told by your doctor. Exercise helps your back heal faster. It also helps avoid future injury by keeping your muscles strong and flexible.  · Do not sit, drive, or stand in one place for more than 30 minutes.  · Do not stay in bed. Resting more than 1-2 days can slow down your recovery.  · Be careful when you bend or lift an object. Use good form when lifting:  ? Bend at your knees.  ? Keep the object close to your body.  ? Do not twist.  · Sleep on a firm mattress. Lie on your side, and bend your knees. If you lie on your back, put a pillow under your knees.  · Take medicines only as told by your doctor.  · Put ice on the injured area.  ? Put ice in a plastic bag.  ? Place a towel between your skin and the bag.  ? Leave the ice on for 20 minutes, 2-3 times a day for the first 2-3 days. After that, you can switch between ice and heat packs.  · Avoid feeling anxious or stressed. Find good ways to deal with stress, such as exercise.  · Maintain a healthy weight. Extra weight puts stress on your back.    Contact a doctor if:  · You have pain that does not go away with rest or medicine.  · You have worsening pain that goes down into your legs or buttocks.  · You have pain that does not get better in one week.  · You have pain at night.  · You lose weight.  · You have a fever or chills.  Get help right away if:  · You cannot control when you poop (bowel movement) or pee (urinate).  · Your arms or legs feel weak.  · Your arms or legs lose feeling (numbness).  · You feel sick  to your stomach (nauseous) or throw up (vomit).  · You have belly (abdominal) pain.  · You feel like you may pass out (faint).  This information is not intended to replace advice given to you by your health care provider. Make sure you discuss any questions you have with your health care provider.  Document Released: 05/02/2008 Document Revised: 04/21/2016 Document Reviewed: 03/18/2014  Elsevier Interactive Patient Education © 2017 [employer] Inc[employer]

## 2017-02-20 ENCOUNTER — Other Ambulatory Visit: Payer: Self-pay | Admitting: Family Medicine

## 2017-02-20 ENCOUNTER — Ambulatory Visit: Payer: Self-pay | Admitting: Family Medicine

## 2017-03-24 ENCOUNTER — Encounter: Payer: Self-pay | Admitting: Family Medicine

## 2017-03-24 ENCOUNTER — Ambulatory Visit (INDEPENDENT_AMBULATORY_CARE_PROVIDER_SITE_OTHER): Payer: Managed Care, Other (non HMO) | Admitting: Family Medicine

## 2017-03-24 MED ORDER — PHENTERMINE HCL 37.5 MG PO TABS: 37.5000 mg | ORAL_TABLET | Freq: Every day | ORAL | 0 refills | Status: DC

## 2017-03-24 MED FILL — PHENTERMINE 37.5 MG TABLET: 37.5 | 30 days supply | Qty: 30 | Fill #0

## 2017-03-24 NOTE — Patient Instructions (Signed)

## 2017-03-24 NOTE — Progress Notes (Signed)
Subjective:  I acted as a Neurosurgeon for Dr. Delman Kitten, LPN    Patient ID: Joan Harris, female    DOB: [birth year], 31 y.o.   MRN: [id number]  Chief Complaint  Patient presents with  . Weight Check    HPI  Patient is in today for assistance in weight loss. Pt report chiropractor told her to not work out for 2 months and she feel she's gaining weight. Pt state she would like to start back on weight loss medication Phentermine. Pt has recently started walking again and has started back at the gym.  As far as diet she admits to slacking some.     Patient Care Team: Donato Schultz, DO as PCP - General (Family Medicine)   Past Medical History:  Diagnosis Date  . Back pain   . Obesity   . Persistent headaches     Past Surgical History:  Procedure Laterality Date  . BARTHOLIN GLAND CYST EXCISION      Family History  Problem Relation Age of Onset  . Heart disease Mother   . Hypertension Mother   . Hyperlipidemia Mother   . Hypertension Sister   . Asthma Daughter   . Breast cancer Maternal Grandmother     Social History   Social History  . Marital status: Single    Spouse name: N/A  . Number of children: 1  . Years of education: College   Occupational History  .  [place]    [employer]   Social History Main Topics  . Smoking status: Former Smoker    Types: Cigars  . Smokeless tobacco: Never Used  . Alcohol use No  . Drug use: No  . Sexual activity: Yes    Birth control/ protection: Pill   Other Topics Concern  . Not on file   Social History Narrative   Marital Status: Single   Children:  Daughter Teacher, early years/pre)   Pets: None   Living Situation: Lives with her daughter   Occupation: Clinical biochemist Geographical information systems officer)   Education: Fish farm manager  Nurse, adult)    Tobacco Use:  She used to smoke Black & Mild 4 x per week. She quit 3 years ago.     Alcohol Use:  None   Drug Use:  None   Diet:  Regular   Exercise:  None   Hobbies: Makeup, Traveling    Caffeine Use: 1-2 cups daily    Outpatient Medications Prior to Visit  Medication Sig Dispense Refill  . cetirizine (ZYRTEC) 10 MG tablet Take 1 tablet by mouth daily.    Marland Kitchen etonogestrel (NEXPLANON) 68 MG IMPL implant 68 mg.    . phentermine (ADIPEX-P) 37.5 MG tablet Take 1 tablet (37.5 mg total) by mouth daily before breakfast. 30 tablet 0   No facility-administered medications prior to visit.     No Known Allergies  Review of Systems  Constitutional: Negative for fever.  HENT: Negative for congestion.   Eyes: Negative for blurred vision.  Respiratory: Negative for cough.   Cardiovascular: Negative for chest pain and palpitations.  Gastrointestinal: Negative for vomiting.  Musculoskeletal: Negative for back pain.  Skin: Negative for rash.  Neurological: Negative for loss of consciousness and headaches.  Psychiatric/Behavioral: Negative for depression, hallucinations, memory loss, substance abuse and suicidal ideas. The patient is not nervous/anxious and does not have insomnia.        Objective:    Physical Exam  Constitutional: She is oriented to person, place, and time. She appears well-developed  and well-nourished.  HENT:  Head: Normocephalic and atraumatic.  Eyes: Conjunctivae and EOM are normal.  Neck: Normal range of motion. Neck supple. No JVD present. Carotid bruit is not present. No thyromegaly present.  Cardiovascular: Normal rate, regular rhythm and normal heart sounds.   No murmur heard. Pulmonary/Chest: Effort normal and breath sounds normal. No respiratory distress. She has no wheezes. She has no rales. She exhibits no tenderness.  Musculoskeletal: She exhibits no edema.  Neurological: She is alert and oriented to person, place, and time.  Psychiatric: She has a normal mood and affect.  Nursing note and vitals reviewed.   BP 118/78 (BP Location: Right Arm, Patient Position: Sitting, Cuff Size: Large)   Pulse 77   Temp 98.1 F  (36.7 C) (Oral)   Resp 16   Ht  (1.753 m)   Wt 253 lb 6.4 oz (114.9 kg)   LMP 03/24/2017 Comment: pt report she is currently on her mestrual period.  BMI 37.42 kg/m  Wt Readings from Last 3 Encounters:  03/24/17 253 lb 6.4 oz (114.9 kg)  02/02/17 252 lb 6.4 oz (114.5 kg)  09/20/16 248 lb 6.4 oz (112.7 kg)   BP Readings from Last 3 Encounters:  03/24/17 118/78  02/02/17 110/78  09/20/16 115/83     Immunization History  Administered Date(s) Administered  . Influenza-Unspecified 09/22/2015, 07/29/2016  . Pneumococcal Conjugate-13 09/22/2015    Health Maintenance  Topic Date Due  . TETANUS/TDAP  08/16/2003  . HIV Screening  08/23/2017 (Originally 08/16/1999)  . INFLUENZA VACCINE  06/28/2017  . PAP SMEAR  09/17/2017    Lab Results  Component Value Date   WBC 8.5 05/03/2016   HGB 12.0 05/03/2016   HCT 36.1 05/03/2016   PLT 376 05/03/2016   GLUCOSE 91 05/03/2016   CHOL 153 08/17/2015   TRIG 78.0 08/17/2015   HDL 46.30 08/17/2015   LDLCALC 91 08/17/2015   ALT 29 05/03/2016   AST 30 05/03/2016   NA 139 05/03/2016   K 3.4 (L) 05/03/2016   CL 104 05/03/2016   CREATININE 0.88 05/03/2016   BUN 10 05/03/2016   CO2 27 05/03/2016   TSH 0.85 08/17/2015   HGBA1C 6.0 (H) 07/22/2014    Lab Results  Component Value Date   TSH 0.85 08/17/2015   Lab Results  Component Value Date   WBC 8.5 05/03/2016   HGB 12.0 05/03/2016   HCT 36.1 05/03/2016   MCV 89.8 05/03/2016   PLT 376 05/03/2016   Lab Results  Component Value Date   NA 139 05/03/2016   K 3.4 (L) 05/03/2016   CO2 27 05/03/2016   GLUCOSE 91 05/03/2016   BUN 10 05/03/2016   CREATININE 0.88 05/03/2016   BILITOT 0.3 05/03/2016   ALKPHOS 61 05/03/2016   AST 30 05/03/2016   ALT 29 05/03/2016   PROT 7.5 05/03/2016   ALBUMIN 3.9 05/03/2016   CALCIUM 9.2 05/03/2016   ANIONGAP 8 05/03/2016   GFR 93.92 08/17/2015   Lab Results  Component Value Date   CHOL 153 08/17/2015   Lab Results  Component Value  Date   HDL 46.30 08/17/2015   Lab Results  Component Value Date   LDLCALC 91 08/17/2015   Lab Results  Component Value Date   TRIG 78.0 08/17/2015   Lab Results  Component Value Date   CHOLHDL 3 08/17/2015   Lab Results  Component Value Date   HGBA1C 6.0 (H) 07/22/2014         Assessment & Plan:  Problem List Items Addressed This Visit    None      I am having Ms. Boney maintain her cetirizine, etonogestrel, and phentermine.  No orders of the defined types were placed in this encounter.   CMA served as Neurosurgeon during this visit. History, Physical and Plan performed by medical provider. Documentation and orders reviewed and attested to.  Donato Schultz, DO   Patient ID: Joan Harris, female   DOB: 1986/07/07, 31 y.o.   MRN: [id number]

## 2017-03-24 NOTE — Progress Notes (Signed)
Pre visit review using our clinic review tool, if applicable. No additional management support is needed unless otherwise documented below in the visit note. 

## 2017-03-25 NOTE — Assessment & Plan Note (Signed)
phenteramine for 3 months only Pt understands side effects and possible risks with meds rto 1 month D/w pt diet and exercise

## 2017-03-26 ENCOUNTER — Emergency Department (HOSPITAL_BASED_OUTPATIENT_CLINIC_OR_DEPARTMENT_OTHER)
Admission: EM | Admit: 2017-03-26 | Discharge: 2017-03-26 | Disposition: A | Discharge status: Home / Self Care | Payer: Managed Care, Other (non HMO) | Attending: Emergency Medicine | Admitting: Emergency Medicine

## 2017-03-26 ENCOUNTER — Encounter (HOSPITAL_BASED_OUTPATIENT_CLINIC_OR_DEPARTMENT_OTHER): Payer: Self-pay | Admitting: Emergency Medicine

## 2017-03-26 DIAGNOSIS — R51 Headache: Secondary | ICD-10-CM | POA: Insufficient documentation

## 2017-03-26 DIAGNOSIS — Z87891 Personal history of nicotine dependence: Secondary | ICD-10-CM | POA: Insufficient documentation

## 2017-03-26 DIAGNOSIS — Z79899 Other long term (current) drug therapy: Secondary | ICD-10-CM | POA: Insufficient documentation

## 2017-03-26 DIAGNOSIS — M549 Dorsalgia, unspecified: Secondary | ICD-10-CM | POA: Diagnosis present

## 2017-03-26 DIAGNOSIS — M62830 Muscle spasm of back: Secondary | ICD-10-CM | POA: Insufficient documentation

## 2017-03-26 MED ORDER — CYCLOBENZAPRINE HCL 10 MG PO TABS: 10.0000 mg | ORAL_TABLET | Freq: Two times a day (BID) | ORAL | 0 refills | Status: DC | PRN

## 2017-03-26 MED ORDER — KETOROLAC TROMETHAMINE 60 MG/2ML IM SOLN
30.0000 mg | Freq: Once | INTRAMUSCULAR | Status: AC
  Administered 2017-03-26: 30 mg via INTRAMUSCULAR
  Filled 2017-03-26: qty 2

## 2017-03-26 MED ORDER — CYCLOBENZAPRINE HCL 10 MG PO TABS
10.0000 mg | ORAL_TABLET | Freq: Once | ORAL | Status: AC
  Administered 2017-03-26: 10 mg via ORAL
  Filled 2017-03-26: qty 1

## 2017-03-26 MED ORDER — IBUPROFEN 600 MG PO TABS: 600.0000 mg | ORAL_TABLET | Freq: Four times a day (QID) | ORAL | 0 refills | Status: DC | PRN

## 2017-03-26 NOTE — Discharge Instructions (Signed)

## 2017-03-26 NOTE — ED Provider Notes (Signed)
[facility]   CSN: [id number] Arrival date & time: 03/26/17  1530  By signing my name below, I, Cynda Acres, attest that this documentation has been prepared under the direction and in the presence of Michela Pitcher PA-C Electronically Signed: Cynda Acres, Scribe. 03/26/17. 4:13 PM.  History   Chief Complaint Chief Complaint  Patient presents with  . Back Pain   HPI Comments: Joan Harris is a 31 y.o. female with a history of chronic back pain, who presents to the Emergency Department complaining of chronic mid back pain, which acutely worsened this afternoon. Patient states her back pain began after getting out of the shower two hours prior to arrival. Patient denies any trauma or fall. Patient reports having back spasms in the past, in which her symptoms  Today feel similar. Patient reports an associated headache. No modifying factors indicated. Patient describes her pain as sharp with a severity of 10/10. Patient states her pain is worse with deep inspiration, nothing improves her pain. Patient is ambulatory in the emergency department. Patient reports receiving a Toradol shot in the past, which improved her pain.  Patient denies any numbness, weakness, loss of bowel/bladder control, dysuria, hematochezia, hematuria, fever, or chills.   The history is provided by the patient. No language interpreter was used.    Past Medical History:  Diagnosis Date  . Back pain   . Obesity   . Persistent headaches     Patient Active Problem List   Diagnosis Date Noted  . Numbness and tingling in left arm 07/25/2016  . Chronic LBP 08/17/2015  . Left arm pain 08/17/2015  . Foot pain 11/30/2014  . Periostitis of ankle and foot (HCC) 11/30/2014  . Plantar fasciitis 11/30/2014  . Tendinitis 11/30/2014  . Periostitis, ankle and foot (HCC) 11/30/2014  . Posterior tibial tendinitis 10/29/2014  . Back pain 06/26/2013  . Acanthosis nigricans 06/26/2013  . Other  malaise and fatigue 06/26/2013  . Obesity, morbid (HCC) 06/26/2013  . GERD (gastroesophageal reflux disease) 06/26/2013  . Headaches, cluster 03/14/2013    Past Surgical History:  Procedure Laterality Date  . BARTHOLIN GLAND CYST EXCISION      OB History    No data available       Home Medications    Prior to Admission medications   Medication Sig Start Date End Date Taking? Authorizing Provider  phentermine (ADIPEX-P) 37.5 MG tablet Take 1 tablet (37.5 mg total) by mouth daily before breakfast. 03/24/17  Yes Yvonne R Lowne Chase, DO  cetirizine (ZYRTEC) 10 MG tablet Take 1 tablet by mouth daily. 01/16/13   Historical Provider, MD  cyclobenzaprine (FLEXERIL) 10 MG tablet Take 1 tablet (10 mg total) by mouth 2 (two) times daily as needed for muscle spasms. 03/26/17   Gwyneth Sprout, MD  etonogestrel (NEXPLANON) 68 MG IMPL implant 68 mg. 12/15/14   Historical Provider, MD  ibuprofen (ADVIL,MOTRIN) 600 MG tablet Take 1 tablet (600 mg total) by mouth every 6 (six) hours as needed. 03/26/17   Gwyneth Sprout, MD    Family History Family History  Problem Relation Age of Onset  . Heart disease Mother   . Hypertension Mother   . Hyperlipidemia Mother   . Hypertension Sister   . Asthma Daughter   . Breast cancer Maternal Grandmother     Social History Social History  Substance Use Topics  . Smoking status: Former Smoker    Types: Cigars  . Smokeless tobacco: Never Used  . Alcohol use Yes  Allergies   Patient has no known allergies.   Review of Systems Review of Systems  Constitutional: Negative for chills and fever.  Respiratory: Negative for shortness of breath.   Cardiovascular: Negative for chest pain.  Gastrointestinal: Negative for abdominal pain, constipation, diarrhea, nausea and vomiting.  Genitourinary: Negative for dysuria and hematuria.  Musculoskeletal: Positive for back pain. Negative for gait problem and neck pain.  Neurological: Positive for  headaches. Negative for weakness and numbness.  All other systems reviewed and are negative.    Physical Exam Updated Vital Signs BP 126/79 (BP Location: Right Arm)   Pulse 68   Temp 98.1 F (36.7 C) (Oral)   Resp 18   Ht  (1.753 m)   Wt 112.9 kg   LMP 03/24/2017 Comment: pt report she is currently on her mestrual period.  SpO2 100%   BMI 36.77 kg/m   Physical Exam  Constitutional: She is oriented to person, place, and time. She appears well-developed and well-nourished.  Tearful.   HENT:  Head: Normocephalic and atraumatic.  Eyes: Conjunctivae and EOM are normal. Pupils are equal, round, and reactive to light. Right eye exhibits no discharge. Left eye exhibits no discharge. No scleral icterus.  Neck: Normal range of motion. Neck supple. No JVD present. No tracheal deviation present.  No midline CSP TTP. No paraspinal muscle spasm  Cardiovascular: Normal rate, regular rhythm, normal heart sounds and intact distal pulses.   Pulmonary/Chest: Effort normal and breath sounds normal.  Abdominal: She exhibits no distension.  Musculoskeletal: Normal range of motion. She exhibits tenderness. She exhibits no edema or deformity.  Limited range of motion due to pain. TTP of the mid back around the level of T10-T12. Right-sided paraspinal muscle spasm. 5/5 strength of the bilateral lower extremities. No midline CSP or LSP TTP.   Neurological: She is alert and oriented to person, place, and time.  Fluent speech, no facial drop, sensation intact globally, antalgic gait, but patient able to heel walk and toe walk.   Skin: Skin is warm and dry. Capillary refill takes less than 2 seconds. She is not diaphoretic.  Psychiatric: She has a normal mood and affect. Her behavior is normal.  Nursing note and vitals reviewed.    ED Treatments / Results  DIAGNOSTIC STUDIES: Oxygen Saturation is 98% on RA, normal by my interpretation.    COORDINATION OF CARE: 4:11 PM Discussed treatment plan  with pt at bedside and pt agreed to plan, which includes a Toradol shot and muscle relaxer.   Labs (all labs ordered are listed, but only abnormal results are displayed) Labs Reviewed - No data to display  EKG  EKG Interpretation None       Radiology No results found.  Procedures Procedures (including critical care time)  Medications Ordered in ED Medications  ketorolac (TORADOL) injection 30 mg (30 mg Intramuscular Given 03/26/17 1628)  cyclobenzaprine (FLEXERIL) tablet 10 mg (10 mg Oral Given 03/26/17 1628)     Initial Impression / Assessment and Plan / ED Course  I have reviewed the triage vital signs and the nursing notes.  Pertinent labs & imaging results that were available during my care of the patient were reviewed by me and considered in my medical decision making (see chart for details).      Patient with back pain typical of prior.  No neurological deficits and normal neuro exam.  Patient can walk but states is painful.  No loss of bowel or bladder control.  No concern for cauda  equina. X-rays not indicated. IM Toradol and Flexeril given in ED.  5:16 PM On re-evaluation the patients pain has improved . The patient is sleeping comfortably in room.    Discussed use of rice protocol, heat, ice, Tylenol, ibuprofen, gentle stretching, frequent short walks, core strengthening, and muscle relaxant prn spasm  for her pain. Discussed follow-up with primary care for reevaluation and possible referral to physical therapy to learn home exercises to avoid the symptoms from flaring again.  Discussed strict ED return precautions. Pt's sister and pt verbalized understanding of and agreement with plan and pt is safe for discharge home at this time.   Final Clinical Impressions(s) / ED Diagnoses   Final diagnoses:  Muscle spasm of back    New Prescriptions Discharge Medication List as of 03/26/2017  5:16 PM    START taking these medications   Details  cyclobenzaprine  (FLEXERIL) 10 MG tablet Take 1 tablet (10 mg total) by mouth 2 (two) times daily as needed for muscle spasms., Starting Sun 03/26/2017, Print    ibuprofen (ADVIL,MOTRIN) 600 MG tablet Take 1 tablet (600 mg total) by mouth every 6 (six) hours as needed., Starting Sun 03/26/2017, Print       I personally performed the services described in this documentation, which was scribed in my presence. The recorded information has been reviewed and is accurate.     Jeanie Sewer, PA-C 03/27/17 1325    Gwyneth Sprout, MD 03/29/17 1116

## 2017-03-26 NOTE — ED Triage Notes (Addendum)
Thoracic back pain which started suddenly after going for a walk today. Tenderness noted to center of back.

## 2017-04-04 MED FILL — DICLOFENAC SOD 75 MG TAB EC: 75 | 30 days supply | Qty: 60 | Fill #0

## 2017-04-04 MED FILL — METHOCARBAMOL 750 MG TABLET: 750 | 30 days supply | Qty: 120 | Fill #0

## 2017-04-21 ENCOUNTER — Telehealth: Payer: Self-pay | Admitting: Family Medicine

## 2017-04-21 ENCOUNTER — Ambulatory Visit: Payer: Managed Care, Other (non HMO) | Admitting: Family Medicine

## 2017-04-21 ENCOUNTER — Ambulatory Visit (INDEPENDENT_AMBULATORY_CARE_PROVIDER_SITE_OTHER): Payer: Managed Care, Other (non HMO) | Admitting: Family Medicine

## 2017-04-21 ENCOUNTER — Encounter: Payer: Self-pay | Admitting: Family Medicine

## 2017-04-21 VITALS — BP 120/72 | HR 84 | Temp 98.4°F | Resp 16 | Ht 69.0 in | Wt 244.0 lb

## 2017-04-21 DIAGNOSIS — L501 Idiopathic urticaria: Secondary | ICD-10-CM

## 2017-04-21 MED ORDER — PHENTERMINE HCL 37.5 MG PO TABS: 37.5000 mg | ORAL_TABLET | Freq: Every day | ORAL | 0 refills | Status: DC

## 2017-04-21 MED ORDER — METHYLPREDNISOLONE ACETATE 80 MG/ML IJ SUSP
80.0000 mg | Freq: Once | INTRAMUSCULAR | Status: AC
  Administered 2017-04-21: 80 mg via INTRAMUSCULAR

## 2017-04-21 MED ORDER — PREDNISONE 10 MG PO TABS: ORAL_TABLET | ORAL | 0 refills | Status: DC

## 2017-04-21 MED FILL — predniSONE 10 MG TABS: 10 | 12 days supply | Qty: 20 | Fill #0

## 2017-04-21 MED FILL — PHENTERMINE 37.5 MG TABLET: 37.5 | 30 days supply | Qty: 30 | Fill #0

## 2017-04-21 NOTE — Telephone Encounter (Signed)
Caller name: Relationship to patient: Self Can be reached: [phone] Pharmacy:  Reason for call: Patient called stating she had car trouble this morning and could not make her appt but she needs refill on her weight loss medication

## 2017-04-21 NOTE — Progress Notes (Signed)
Patient ID: Joan Harris, female   DOB: [birth year], 31 y.o.   MRN: [id number]    Subjective:  I acted as a Neurosurgeon for Dr. Zola Button.  Joan Harris, CMA   Patient ID: Joan Harris, female    DOB: 03/03/1986, 31 y.o.   MRN: [id number]  Chief Complaint  Patient presents with  . Obesity    follow up started phentermine on   . Rash    on back on each side    HPI  Patient is in today for follow up obesity.  She was started on phentermine.  She is doing well with no side effects.  She also has a rash on back yesterday.  She thought she may have been bitten but not sure.  The rash is now itchy and tender.  Patient Care Team: Zola Button, Grayling Congress, DO as PCP - General (Family Medicine)   Past Medical History:  Diagnosis Date  . Back pain   . Obesity   . Persistent headaches     Past Surgical History:  Procedure Laterality Date  . BARTHOLIN GLAND CYST EXCISION      Family History  Problem Relation Age of Onset  . Heart disease Mother   . Hypertension Mother   . Hyperlipidemia Mother   . Hypertension Sister   . Asthma Daughter   . Breast cancer Maternal Grandmother     Social History   Social History  . Marital status: Single    Spouse name: N/A  . Number of children: 1  . Years of education: College   Occupational History  .  [employer]    [employer]   Social History Main Topics  . Smoking status: Former Smoker    Types: Cigars  . Smokeless tobacco: Never Used  . Alcohol use Yes  . Drug use: No  . Sexual activity: Yes    Birth control/ protection: Implant   Other Topics Concern  . Not on file   Social History Narrative   Marital Status: Single   Children:  Daughter Teacher, early years/pre)   Pets: None   Living Situation: Lives with her daughter   Occupation: Clinical biochemist Geographical information systems officer)   Education: Fish farm manager  Nurse, adult)    Tobacco Use:  She used to smoke Black & Mild 4 x per week. She quit 3 years ago.     Alcohol Use:  None   Drug Use:  None   Diet:  Regular   Exercise:  None   Hobbies: Makeup, Traveling    Caffeine Use: 1-2 cups daily    Outpatient Medications Prior to Visit  Medication Sig Dispense Refill  . cetirizine (ZYRTEC) 10 MG tablet Take 1 tablet by mouth daily.    . cyclobenzaprine (FLEXERIL) 10 MG tablet Take 1 tablet (10 mg total) by mouth 2 (two) times daily as needed for muscle spasms. 20 tablet 0  . etonogestrel (NEXPLANON) 68 MG IMPL implant 68 mg.    . phentermine (ADIPEX-P) 37.5 MG tablet Take 1 tablet (37.5 mg total) by mouth daily before breakfast. 30 tablet 0  . ibuprofen (ADVIL,MOTRIN) 600 MG tablet Take 1 tablet (600 mg total) by mouth every 6 (six) hours as needed. 20 tablet 0   No facility-administered medications prior to visit.     No Known Allergies  Review of Systems  Constitutional: Negative for fever and malaise/fatigue.  HENT: Negative for congestion.   Eyes: Negative for blurred vision.  Respiratory: Negative for cough and shortness of breath.   Cardiovascular: Negative for  chest pain, palpitations and leg swelling.  Gastrointestinal: Negative for vomiting.  Musculoskeletal: Negative for back pain.  Skin: Positive for itching and rash.       On back   Neurological: Negative for loss of consciousness and headaches.       Objective:    Physical Exam  Constitutional: She is oriented to person, place, and time. She appears well-developed and well-nourished. No distress.  HENT:  Head: Normocephalic and atraumatic.  Eyes: Conjunctivae are normal.  Neck: Normal range of motion. No thyromegaly present.  Cardiovascular: Normal rate and regular rhythm.   Pulmonary/Chest: Effort normal and breath sounds normal. She has no wheezes.  Abdominal: Soft. Bowel sounds are normal. There is no tenderness.  Musculoskeletal: Normal range of motion. She exhibits no edema or deformity.  Neurological: She is alert and oriented to person, place, and time.  Skin: Skin is warm and  dry. She is not diaphoretic.  Psychiatric: She has a normal mood and affect.    BP 120/72 (BP Location: Right Arm, Cuff Size: Large)   Pulse 84   Temp 98.4 F (36.9 C) (Oral)   Resp 16   Ht 5\' 9"  (1.753 m)   Wt 244 lb (110.7 kg)   LMP 04/21/2017 Comment: pt report she is currently on her mestrual period.  SpO2 100%   BMI 36.03 kg/m  Wt Readings from Last 3 Encounters:  04/21/17 244 lb (110.7 kg)  03/26/17 249 lb (112.9 kg)  03/24/17 253 lb 6.4 oz (114.9 kg)   BP Readings from Last 3 Encounters:  04/21/17 120/72  03/26/17 126/79  03/24/17 118/78     Immunization History  Administered Date(s) Administered  . Influenza-Unspecified 09/22/2015, 07/29/2016  . Pneumococcal Conjugate-13 09/22/2015    Health Maintenance  Topic Date Due  . TETANUS/TDAP  08/16/2003  . HIV Screening  08/23/2017 (Originally 08/16/1999)  . INFLUENZA VACCINE  06/28/2017  . PAP SMEAR  09/17/2017    Lab Results  Component Value Date   WBC 8.5 05/03/2016   HGB 12.0 05/03/2016   HCT 36.1 05/03/2016   PLT 376 05/03/2016   GLUCOSE 91 05/03/2016   CHOL 153 08/17/2015   TRIG 78.0 08/17/2015   HDL 46.30 08/17/2015   LDLCALC 91 08/17/2015   ALT 29 05/03/2016   AST 30 05/03/2016   NA 139 05/03/2016   K 3.4 (L) 05/03/2016   CL 104 05/03/2016   CREATININE 0.88 05/03/2016   BUN 10 05/03/2016   CO2 27 05/03/2016   TSH 0.85 08/17/2015   HGBA1C 6.0 (H) 07/22/2014    Lab Results  Component Value Date   TSH 0.85 08/17/2015   Lab Results  Component Value Date   WBC 8.5 05/03/2016   HGB 12.0 05/03/2016   HCT 36.1 05/03/2016   MCV 89.8 05/03/2016   PLT 376 05/03/2016   Lab Results  Component Value Date   NA 139 05/03/2016   K 3.4 (L) 05/03/2016   CO2 27 05/03/2016   GLUCOSE 91 05/03/2016   BUN 10 05/03/2016   CREATININE 0.88 05/03/2016   BILITOT 0.3 05/03/2016   ALKPHOS 61 05/03/2016   AST 30 05/03/2016   ALT 29 05/03/2016   PROT 7.5 05/03/2016   ALBUMIN 3.9 05/03/2016   CALCIUM 9.2  05/03/2016   ANIONGAP 8 05/03/2016   GFR 93.92 08/17/2015   Lab Results  Component Value Date   CHOL 153 08/17/2015   Lab Results  Component Value Date   HDL 46.30 08/17/2015   Lab Results  Component  Value Date   LDLCALC 91 08/17/2015   Lab Results  Component Value Date   TRIG 78.0 08/17/2015   Lab Results  Component Value Date   CHOLHDL 3 08/17/2015   Lab Results  Component Value Date   HGBA1C 6.0 (H) 07/22/2014         Assessment & Plan:   Problem List Items Addressed This Visit    None    Visit Diagnoses    Idiopathic urticaria    -  Primary   Relevant Medications   predniSONE (DELTASONE) 10 MG tablet   methylPREDNISolone acetate (DEPO-MEDROL) injection 80 mg (Completed)   Morbid obesity due to excess calories (HCC)       Relevant Medications   phentermine (ADIPEX-P) 37.5 MG tablet    discussed diet and exercise rto 1 month or sooner prn  I have discontinued Ms. Manger's ibuprofen. I am also having her start on predniSONE. Additionally, I am having her maintain her cetirizine, etonogestrel, cyclobenzaprine, diclofenac, methocarbamol, and phentermine. We administered methylPREDNISolone acetate.  Meds ordered this encounter  Medications  . diclofenac (VOLTAREN) 75 MG EC tablet    Refill:  2  . methocarbamol (ROBAXIN) 750 MG tablet    Refill:  2  . phentermine (ADIPEX-P) 37.5 MG tablet    Sig: Take 1 tablet (37.5 mg total) by mouth daily before breakfast.    Dispense:  30 tablet    Refill:  0  . predniSONE (DELTASONE) 10 MG tablet    Sig: TAKE 3 TABLETS PO QD FOR 3 DAYS THEN TAKE 2 TABLETS PO QD FOR 3 DAYS THEN TAKE 1 TABLET PO QD FOR 3 DAYS THEN TAKE 1/2 TAB PO QD FOR 3 DAYS    Dispense:  20 tablet    Refill:  0  . methylPREDNISolone acetate (DEPO-MEDROL) injection 80 mg    CMA served as Neurosurgeonscribe during this visit. History, Physical and Plan performed by medical provider. Documentation and orders reviewed and attested to.  Donato SchultzYvonne R Lowne Chase,  DO

## 2017-04-21 NOTE — Patient Instructions (Signed)

## 2017-04-21 NOTE — Telephone Encounter (Signed)
[facility]CMA approved[facility]

## 2017-05-22 ENCOUNTER — Ambulatory Visit (INDEPENDENT_AMBULATORY_CARE_PROVIDER_SITE_OTHER): Payer: Managed Care, Other (non HMO) | Admitting: Family Medicine

## 2017-05-22 ENCOUNTER — Encounter: Payer: Self-pay | Admitting: Family Medicine

## 2017-05-22 MED ORDER — PHENTERMINE HCL 37.5 MG PO TABS: 37.5000 mg | ORAL_TABLET | Freq: Every day | ORAL | 0 refills | Status: DC

## 2017-05-22 MED FILL — PHENTERMINE 37.5 MG TABLET: 37.5 | 30 days supply | Qty: 30 | Fill #0

## 2017-05-22 NOTE — Progress Notes (Signed)
Patient ID: Joan Harris, female   DOB: [birth year], 31 y.o.   MRN: [id number]     Subjective:  I acted as a Neurosurgeon for Dr. Zola Button.  [name], CMA   Patient ID: Joan Harris, female    DOB: 11/16/1986, 31 y.o.   MRN: [id number]  Chief Complaint  Patient presents with  . Obesity    HPI  Patient is in today for follow up obesity.  She has been doing well on current treatment.  Patient Care Team: Zola Button, Grayling Congress, DO as PCP - General (Family Medicine)   Past Medical History:  Diagnosis Date  . Back pain   . Obesity   . Persistent headaches     Past Surgical History:  Procedure Laterality Date  . BARTHOLIN GLAND CYST EXCISION      Family History  Problem Relation Age of Onset  . Heart disease Mother   . Hypertension Mother   . Hyperlipidemia Mother   . Hypertension Sister   . Asthma Daughter   . Breast cancer Maternal Grandmother     Social History   Social History  . Marital status: Single    Spouse name: N/A  . Number of children: 1  . Years of education: College   Occupational History  .  [employer]    [employer]   Social History Main Topics  . Smoking status: Former Smoker    Types: Cigars  . Smokeless tobacco: Never Used  . Alcohol use Yes  . Drug use: No  . Sexual activity: Yes    Birth control/ protection: Implant   Other Topics Concern  . Not on file   Social History Narrative   Marital Status: Single   Children:  Daughter Teacher, early years/pre)   Pets: None   Living Situation: Lives with her daughter   Occupation: Clinical biochemist Geographical information systems officer)   Education: Fish farm manager  Nurse, adult)    Tobacco Use:  She used to smoke Black & Mild 4 x per week. She quit 3 years ago.     Alcohol Use:  None   Drug Use:  None   Diet:  Regular   Exercise:  None   Hobbies: Makeup, Traveling    Caffeine Use: 1-2 cups daily    Outpatient Medications Prior to Visit  Medication Sig Dispense Refill  . cetirizine (ZYRTEC) 10 MG  tablet Take 1 tablet by mouth daily.    . cyclobenzaprine (FLEXERIL) 10 MG tablet Take 1 tablet (10 mg total) by mouth 2 (two) times daily as needed for muscle spasms. 20 tablet 0  . diclofenac (VOLTAREN) 75 MG EC tablet   2  . etonogestrel (NEXPLANON) 68 MG IMPL implant 68 mg.    . phentermine (ADIPEX-P) 37.5 MG tablet Take 1 tablet (37.5 mg total) by mouth daily before breakfast. 30 tablet 0  . methocarbamol (ROBAXIN) 750 MG tablet   2  . predniSONE (DELTASONE) 10 MG tablet TAKE 3 TABLETS PO QD FOR 3 DAYS THEN TAKE 2 TABLETS PO QD FOR 3 DAYS THEN TAKE 1 TABLET PO QD FOR 3 DAYS THEN TAKE 1/2 TAB PO QD FOR 3 DAYS 20 tablet 0   No facility-administered medications prior to visit.     No Known Allergies  Review of Systems  Constitutional: Negative for fever and malaise/fatigue.  HENT: Negative for congestion.   Eyes: Negative for blurred vision.  Respiratory: Negative for cough and shortness of breath.   Cardiovascular: Negative for chest pain, palpitations and leg swelling.  Gastrointestinal: Negative for  vomiting.  Musculoskeletal: Negative for back pain.  Skin: Negative for rash.  Neurological: Negative for loss of consciousness and headaches.       Objective:    Physical Exam  Constitutional: She is oriented to person, place, and time. She appears well-developed and well-nourished. No distress.  HENT:  Head: Normocephalic and atraumatic.  Eyes: Conjunctivae are normal.  Neck: Normal range of motion. No thyromegaly present.  Cardiovascular: Normal rate and regular rhythm.   Pulmonary/Chest: Effort normal and breath sounds normal. She has no wheezes.  Abdominal: Soft. Bowel sounds are normal. There is no tenderness.  Musculoskeletal: Normal range of motion. She exhibits no edema or deformity.  Neurological: She is alert and oriented to person, place, and time.  Skin: Skin is warm and dry. She is not diaphoretic.  Psychiatric: She has a normal mood and affect.  Nursing note  and vitals reviewed.   BP 116/76 (BP Location: Right Arm, Cuff Size: Large)   Pulse 69   Temp 98.3 F (36.8 C) (Oral)   Resp 16   Ht 5\' 9"  (1.753 m)   Wt 240 lb 9.6 oz (109.1 kg)   LMP 05/19/2017   SpO2 97%   BMI 35.53 kg/m  Wt Readings from Last 3 Encounters:  05/22/17 240 lb 9.6 oz (109.1 kg)  04/21/17 244 lb (110.7 kg)  03/26/17 249 lb (112.9 kg)   BP Readings from Last 3 Encounters:  05/22/17 116/76  04/21/17 120/72  03/26/17 126/79     Immunization History  Administered Date(s) Administered  . Influenza-Unspecified 09/22/2015, 07/29/2016  . Pneumococcal Conjugate-13 09/22/2015    Health Maintenance  Topic Date Due  . TETANUS/TDAP  08/16/2003  . HIV Screening  08/23/2017 (Originally 08/16/1999)  . INFLUENZA VACCINE  06/28/2017  . PAP SMEAR  09/17/2017    Lab Results  Component Value Date   WBC 8.5 05/03/2016   HGB 12.0 05/03/2016   HCT 36.1 05/03/2016   PLT 376 05/03/2016   GLUCOSE 91 05/03/2016   CHOL 153 08/17/2015   TRIG 78.0 08/17/2015   HDL 46.30 08/17/2015   LDLCALC 91 08/17/2015   ALT 29 05/03/2016   AST 30 05/03/2016   NA 139 05/03/2016   K 3.4 (L) 05/03/2016   CL 104 05/03/2016   CREATININE 0.88 05/03/2016   BUN 10 05/03/2016   CO2 27 05/03/2016   TSH 0.85 08/17/2015   HGBA1C 6.0 (H) 07/22/2014    Lab Results  Component Value Date   TSH 0.85 08/17/2015   Lab Results  Component Value Date   WBC 8.5 05/03/2016   HGB 12.0 05/03/2016   HCT 36.1 05/03/2016   MCV 89.8 05/03/2016   PLT 376 05/03/2016   Lab Results  Component Value Date   NA 139 05/03/2016   K 3.4 (L) 05/03/2016   CO2 27 05/03/2016   GLUCOSE 91 05/03/2016   BUN 10 05/03/2016   CREATININE 0.88 05/03/2016   BILITOT 0.3 05/03/2016   ALKPHOS 61 05/03/2016   AST 30 05/03/2016   ALT 29 05/03/2016   PROT 7.5 05/03/2016   ALBUMIN 3.9 05/03/2016   CALCIUM 9.2 05/03/2016   ANIONGAP 8 05/03/2016   GFR 93.92 08/17/2015   Lab Results  Component Value Date   CHOL 153  08/17/2015   Lab Results  Component Value Date   HDL 46.30 08/17/2015   Lab Results  Component Value Date   LDLCALC 91 08/17/2015   Lab Results  Component Value Date   TRIG 78.0 08/17/2015   Lab  Results  Component Value Date   CHOLHDL 3 08/17/2015   Lab Results  Component Value Date   HGBA1C 6.0 (H) 07/22/2014         Assessment & Plan:   Problem List Items Addressed This Visit    None    Visit Diagnoses    Morbid obesity due to excess calories (HCC)       Relevant Medications   phentermine (ADIPEX-P) 37.5 MG tablet    1 more month of phenteramine--- con't diet and exercise  I have discontinued Ms. Trentham's methocarbamol and predniSONE. I am also having her maintain her cetirizine, etonogestrel, cyclobenzaprine, diclofenac, and phentermine.  Meds ordered this encounter  Medications  . phentermine (ADIPEX-P) 37.5 MG tablet    Sig: Take 1 tablet (37.5 mg total) by mouth daily before breakfast.    Dispense:  30 tablet    Refill:  0    CMA served as scribe during this visit. History, Physical and Plan performed by medical provider. Documentation and orders reviewed and attested to.  Donato SchultzYvonne R Lowne Chase, DO

## 2017-05-22 NOTE — Patient Instructions (Signed)

## 2017-06-19 ENCOUNTER — Ambulatory Visit: Payer: Self-pay | Admitting: Family Medicine

## 2017-06-19 ENCOUNTER — Telehealth: Payer: Self-pay | Admitting: Family Medicine

## 2017-06-19 DIAGNOSIS — Z0289 Encounter for other administrative examinations: Secondary | ICD-10-CM

## 2017-06-19 NOTE — Telephone Encounter (Signed)
Patient lvm at 9:34am cancelling 10am appointment, charge or no charge

## 2017-06-19 NOTE — Telephone Encounter (Signed)
charge 

## 2017-08-08 MED FILL — FLUOCINONIDE 0.05% CREAM: 0.05 | 30 days supply | Qty: 60 | Fill #0

## 2017-08-28 MED FILL — IVERMECTIN 3 MG TABLET: 3 | 8 days supply | Qty: 16 | Fill #0

## 2017-10-11 MED FILL — metroNIDAZOLE 500 MG TABS: 500 | 1 days supply | Qty: 4 | Fill #0

## 2017-10-28 ENCOUNTER — Encounter (HOSPITAL_BASED_OUTPATIENT_CLINIC_OR_DEPARTMENT_OTHER): Payer: Self-pay | Admitting: *Deleted

## 2017-10-28 ENCOUNTER — Emergency Department (HOSPITAL_BASED_OUTPATIENT_CLINIC_OR_DEPARTMENT_OTHER): Payer: Worker's Compensation

## 2017-10-28 ENCOUNTER — Other Ambulatory Visit: Payer: Self-pay

## 2017-10-28 ENCOUNTER — Emergency Department (HOSPITAL_BASED_OUTPATIENT_CLINIC_OR_DEPARTMENT_OTHER)
Admission: EM | Admit: 2017-10-28 | Discharge: 2017-10-28 | Disposition: A | Discharge status: Home / Self Care | Payer: Worker's Compensation | Attending: Emergency Medicine | Admitting: Emergency Medicine

## 2017-10-28 DIAGNOSIS — Z043 Encounter for examination and observation following other accident: Secondary | ICD-10-CM | POA: Diagnosis not present

## 2017-10-28 DIAGNOSIS — Z87891 Personal history of nicotine dependence: Secondary | ICD-10-CM | POA: Insufficient documentation

## 2017-10-28 DIAGNOSIS — X509XXA Other and unspecified overexertion or strenuous movements or postures, initial encounter: Secondary | ICD-10-CM | POA: Diagnosis not present

## 2017-10-28 DIAGNOSIS — Z79899 Other long term (current) drug therapy: Secondary | ICD-10-CM | POA: Diagnosis not present

## 2017-10-28 DIAGNOSIS — M25572 Pain in left ankle and joints of left foot: Secondary | ICD-10-CM | POA: Insufficient documentation

## 2017-10-28 MED ORDER — ACETAMINOPHEN 500 MG PO TABS: 500.0000 mg | ORAL_TABLET | Freq: Four times a day (QID) | ORAL | 0 refills | Status: DC | PRN

## 2017-10-28 MED ORDER — IBUPROFEN 800 MG PO TABS: 800.0000 mg | ORAL_TABLET | Freq: Three times a day (TID) | ORAL | 0 refills | Status: DC

## 2017-10-28 NOTE — ED Triage Notes (Signed)
Patient states she stepped out of her truck last night and rolled her left ankle.  Now has pain and swelling in the dorsal foot and bilateral malleolus. CMS intact.

## 2017-10-28 NOTE — ED Provider Notes (Signed)
[facility] EMERGENCY DEPARTMENT Provider Note   CSN: [id number] Arrival date & time: 10/28/17  [id number]     History   Chief Complaint Chief Complaint  Patient presents with  . Ankle Pain    Left    HPI Joan Harris is a 31 y.o. female who presents with a left ankle and foot pain after rolling it last night.  Patient reports she stepped out of her truck wrong and had an inversion type injury.  She has had some minor tingling, but no numbness.  She has not been able to ambulate without significant pain.  She has used ice and ibuprofen at home without relief.  She reports hitting her knee a few days ago and has a bruise, however denies any new knee pain from this injury.  She did not fall and hit her head.  She denies any other injuries.  HPI  Past Medical History:  Diagnosis Date  . Back pain   . Obesity   . Persistent headaches     Patient Active Problem List   Diagnosis Date Noted  . Numbness and tingling in left arm 07/25/2016  . Chronic LBP 08/17/2015  . Left arm pain 08/17/2015  . Foot pain 11/30/2014  . Periostitis of ankle and foot (HCC) 11/30/2014  . Plantar fasciitis 11/30/2014  . Tendinitis 11/30/2014  . Periostitis, ankle and foot (HCC) 11/30/2014  . Posterior tibial tendinitis 10/29/2014  . Back pain 06/26/2013  . Acanthosis nigricans 06/26/2013  . Other malaise and fatigue 06/26/2013  . Obesity, morbid (HCC) 06/26/2013  . GERD (gastroesophageal reflux disease) 06/26/2013  . Headaches, cluster 03/14/2013    Past Surgical History:  Procedure Laterality Date  . BARTHOLIN GLAND CYST EXCISION      OB History    No data available       Home Medications    Prior to Admission medications   Medication Sig Start Date End Date Taking? Authorizing Provider  cetirizine (ZYRTEC) 10 MG tablet Take 1 tablet by mouth daily. 01/16/13  Yes [provider]  etonogestrel (NEXPLANON) 68 MG IMPL implant 68 mg. 12/15/14  Yes [provider]  acetaminophen (TYLENOL) 500 MG tablet Take 1 tablet (500 mg total) by mouth every 6 (six) hours as needed. 10/28/17   Prisha Hiley, Waylan BogaAlexandra M, PA-C  cyclobenzaprine (FLEXERIL) 10 MG tablet Take 1 tablet (10 mg total) by mouth 2 (two) times daily as needed for muscle spasms. 03/26/17   Gwyneth SproutPlunkett, Whitney, MD  diclofenac (VOLTAREN) 75 MG EC tablet  04/04/17   [provider]  ibuprofen (ADVIL,MOTRIN) 800 MG tablet Take 1 tablet (800 mg total) by mouth 3 (three) times daily. 10/28/17   Shylyn Younce, Waylan BogaAlexandra M, PA-C  phentermine (ADIPEX-P) 37.5 MG tablet Take 1 tablet (37.5 mg total) by mouth daily before breakfast. 05/22/17   Zola ButtonLowne Chase, Grayling CongressYvonne R, DO    Family History Family History  Problem Relation Age of Onset  . Heart disease Mother   . Hypertension Mother   . Hyperlipidemia Mother   . Hypertension Sister   . Asthma Daughter   . Breast cancer Maternal Grandmother     Social History Social History   Tobacco Use  . Smoking status: Former Smoker    Types: Cigars  . Smokeless tobacco: Never Used  Substance Use Topics  . Alcohol use: Yes  . Drug use: No     Allergies   Patient has no known allergies.   Review of Systems Review of Systems  Musculoskeletal: Positive for  arthralgias (L ankle) and joint swelling (L ankle).  Neurological: Negative for numbness.     Physical Exam Updated Vital Signs BP 122/70 (BP Location: Right Arm)   Pulse 78   Temp 98.1 F (36.7 C) (Oral)   Resp 18   Ht 5\' 9"  (1.753 m)   Wt 113.4 kg (250 lb)   LMP 10/26/2017 (Exact Date)   SpO2 100%   BMI 36.92 kg/m   Physical Exam  Constitutional: She appears well-developed and well-nourished. No distress.  HENT:  Head: Normocephalic and atraumatic.  Mouth/Throat: Oropharynx is clear and moist. No oropharyngeal exudate.  Eyes: Conjunctivae are normal. Pupils are equal, round, and reactive to light. Right eye exhibits no discharge. Left eye exhibits no discharge. No scleral icterus.  Neck: Normal  range of motion. Neck supple. No thyromegaly present.  Cardiovascular: Normal rate, regular rhythm, normal heart sounds and intact distal pulses. Exam reveals no gallop and no friction rub.  No murmur heard. Pulmonary/Chest: Effort normal and breath sounds normal. No stridor. No respiratory distress. She has no wheezes. She has no rales.  Musculoskeletal: She exhibits no edema.       Left ankle: She exhibits swelling. She exhibits normal range of motion and normal pulse. No lateral malleolus, no medial malleolus, no head of 5th metatarsal and no proximal fibula tenderness found. Achilles tendon normal. Achilles tendon exhibits no pain and no defect.       Feet:  No bony tenderness to bilateral malleoli, however significant tenderness and edema anterior to each; DP pulse intact; full range of motion with plantar and dorsiflexion; sensation intact, cap refill less than 2 seconds  Lymphadenopathy:    She has no cervical adenopathy.  Neurological: She is alert. Coordination normal.  Skin: Skin is warm and dry. No rash noted. She is not diaphoretic. No pallor.  Psychiatric: She has a normal mood and affect.  Nursing note and vitals reviewed.    ED Treatments / Results  Labs (all labs ordered are listed, but only abnormal results are displayed) Labs Reviewed - No data to display  EKG  EKG Interpretation None       Radiology Dg Ankle Complete Left  Result Date: 10/28/2017 CLINICAL DATA:  Ankle injury yesterday with persistent ankle/ foot pain. EXAM: LEFT ANKLE COMPLETE - 3+ VIEW COMPARISON:  None. FINDINGS: There is no evidence of fracture, dislocation, or joint effusion. There is no evidence of arthropathy or other focal bone abnormality. Soft tissues are unremarkable. IMPRESSION: Negative. Electronically Signed   By: Kennith CenterEric  Mansell M.D.   On: 10/28/2017 11:03   Dg Foot Complete Left  Result Date: 10/28/2017 CLINICAL DATA:  Ankle injury yesterday with persistent ankle/ foot pain. EXAM:  LEFT FOOT - COMPLETE 3+ VIEW COMPARISON:  None. FINDINGS: There is no evidence of fracture or dislocation. There is no evidence of arthropathy or other focal bone abnormality. Soft tissues are unremarkable. IMPRESSION: Negative. Electronically Signed   By: Kennith CenterEric  Mansell M.D.   On: 10/28/2017 11:02    Procedures Procedures (including critical care time)  Medications Ordered in ED Medications - No data to display   Initial Impression / Assessment and Plan / ED Course  I have reviewed the triage vital signs and the nursing notes.  Pertinent labs & imaging results that were available during my care of the patient were reviewed by me and considered in my medical decision making (see chart for details).     Patient X-Ray negative for obvious fracture or dislocation. Home care discussed  including rest and elevate the injured ankle, apply ice intermittently. Use crutches without weight bearing until able to comfortable bear partial weight, then progress to full weight bearing as tolerated. ASO splint applied.  Patient given follow-up to Dr. Pearletha Forge, sports medicine, as needed.  Patient understands and agrees with plan.  Patient vitals stable throughout ED course and discharged in satisfactory condition.    Final Clinical Impressions(s) / ED Diagnoses   Final diagnoses:  Acute left ankle pain    ED Discharge Orders        Ordered    ibuprofen (ADVIL,MOTRIN) 800 MG tablet  3 times daily     10/28/17 1110    acetaminophen (TYLENOL) 500 MG tablet  Every 6 hours PRN     10/28/17 [id number] [place], PA-C 10/28/17 1111    Cathren Laine, MD 10/28/17 1218

## 2017-10-28 NOTE — Discharge Instructions (Signed)
Medications: Ibuprofen, Tylenol  Treatment: Take ibuprofen every 8 hours to help with pain and swelling. You can alternate with Tylenol for breakthrough pain 4 hours after taking ibuprofen. Use ice 3-4 times daily alternating 20 minutes on, 20 minutes off. Keep your leg elevated whenever you're not walking on it. Wear your splint at all times except when bathing. Use crutches at all times initially and begin partial weightbearing as tolerated.  Follow-up: Please follow-up with the sports medicine doctor, Dr. Pearletha ForgeHudnall, if your symptoms are not improving over the next 7-10 days. Please return to the emergency department if you develop any new or worsening symptoms.

## 2017-11-02 ENCOUNTER — Ambulatory Visit (INDEPENDENT_AMBULATORY_CARE_PROVIDER_SITE_OTHER): Payer: Managed Care, Other (non HMO) | Admitting: Family Medicine

## 2017-11-02 ENCOUNTER — Encounter: Payer: Self-pay | Admitting: Family Medicine

## 2017-11-02 DIAGNOSIS — S99912A Unspecified injury of left ankle, initial encounter: Secondary | ICD-10-CM

## 2017-11-02 NOTE — Patient Instructions (Signed)
You have an ankle sprain. Ice the area for 15 minutes at a time, 3-4 times a day Aleve 2 tabs twice a day with food OR ibuprofen 3 tabs three times a day with food for pain and inflammation. Elevate above the level of your heart when possible Crutches if needed to help with walking Bear weight when tolerated Use ankle brace when up and walking around to help with stability while you recover from this injury. Come out of the brace twice a day to do Up/down and alphabet exercises 2-3 sets of each. Consider physical therapy for strengthening and balance exercises. Follow up in 2 weeks. I'd expect this to take a total of 4-6 weeks to heal.

## 2017-11-06 ENCOUNTER — Encounter: Payer: Self-pay | Admitting: Family Medicine

## 2017-11-06 DIAGNOSIS — S99912D Unspecified injury of left ankle, subsequent encounter: Secondary | ICD-10-CM | POA: Insufficient documentation

## 2017-11-06 NOTE — Progress Notes (Signed)
PCP: Donato SchultzLowne Chase, Yvonne R, DO  Subjective:   HPI: Patient is a 31 y.o. female here for left ankle injury.  Patient reports on 11/30 she stepped off the back of a truck and accidentally inverted her left ankle badly. Pain, swelling laterally. Has been wearing ASO and using crutches since. Pain 5/10 and sharp, worse with ambulation. Ibuprofen and tylenol help with the pain. Bruising but no other skin changes. No numbness. No prior left ankle injuries.  Past Medical History:  Diagnosis Date  . Back pain   . Obesity   . Persistent headaches     Current Outpatient Medications on File Prior to Visit  Medication Sig Dispense Refill  . acetaminophen (TYLENOL) 500 MG tablet Take 1 tablet (500 mg total) by mouth every 6 (six) hours as needed. 30 tablet 0  . cetirizine (ZYRTEC) 10 MG tablet Take 1 tablet by mouth daily.    . cyclobenzaprine (FLEXERIL) 10 MG tablet Take 1 tablet (10 mg total) by mouth 2 (two) times daily as needed for muscle spasms. 20 tablet 0  . diclofenac (VOLTAREN) 75 MG EC tablet   2  . etonogestrel (NEXPLANON) 68 MG IMPL implant 68 mg.    . phentermine (ADIPEX-P) 37.5 MG tablet Take 1 tablet (37.5 mg total) by mouth daily before breakfast. 30 tablet 0   No current facility-administered medications on file prior to visit.     Past Surgical History:  Procedure Laterality Date  . BARTHOLIN GLAND CYST EXCISION      No Known Allergies  Social History   Socioeconomic History  . Marital status: Single    Spouse name: Not on file  . Number of children: 1  . Years of education: College  . Highest education level: Not on file  Social Needs  . Financial resource strain: Not on file  . Food insecurity - worry: Not on file  . Food insecurity - inability: Not on file  . Transportation needs - medical: Not on file  . Transportation needs - non-medical: Not on file  Occupational History    Employer: [employer]    Comment: [employer]  Tobacco Use  . Smoking  status: Former Smoker    Types: Cigars  . Smokeless tobacco: Never Used  Substance and Sexual Activity  . Alcohol use: Yes  . Drug use: No  . Sexual activity: Yes    Birth control/protection: Implant  Other Topics Concern  . 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 She quit 3 years ago.     Alcohol Use:  None   Drug Use:  None   Diet:  Regular   Exercise:  None   Hobbies: Makeup, Traveling    Caffeine Use: 1-2 cups daily    Family History  Problem Relation Age of Onset  . Heart disease Mother   . Hypertension Mother   . Hyperlipidemia Mother   . Hypertension Sister   . Asthma Daughter   . Breast cancer Maternal Grandmother     BP (!) 145/89   Pulse (!) 105   Ht 5\' 10"  (1.778 m)   Wt 250 lb (113.4 kg)   LMP 10/26/2017 (Exact Date)   BMI 35.87  kg/m   Review of Systems: See HPI above.     Objective:  Physical Exam:  Gen: NAD, comfortable in exam room  Left ankle: Mod swelling and bruising especially laterally.  No other deformity. Mild limitation ROM all directions with 5/5 strength. TTP over ATFL, less lateral malleolus.  No other tenderness. 2+ ant drawer and 1+ talar tilt.   Negative syndesmotic compression. Thompsons test negative. NV intact distally.  Right ankle: No gross deformity, swelling, ecchymoses FROM with 5/5 strength. No TTP Negative ant drawer and talar tilt.   NV intact distally.   Assessment & Plan:  1. Left ankle injury - independently reviewed radiographs and no evidence fracture.  Consistent with lateral ankle sprain.  Icing, aleve or ibuprofen.  Elevation.  ASO with crutches if needed.  Shown motion exercises to do twice a day.  F/u in 2 weeks.  Consider PT  in future.

## 2017-11-06 NOTE — Assessment & Plan Note (Signed)
independently reviewed radiographs and no evidence fracture.  Consistent with lateral ankle sprain.  Icing, aleve or ibuprofen.  Elevation.  ASO with crutches if needed.  Shown motion exercises to do twice a day.  F/u in 2 weeks.  Consider PT in future.

## 2017-11-16 ENCOUNTER — Encounter: Payer: Self-pay | Admitting: Family Medicine

## 2017-11-16 ENCOUNTER — Ambulatory Visit (INDEPENDENT_AMBULATORY_CARE_PROVIDER_SITE_OTHER): Payer: Managed Care, Other (non HMO) | Admitting: Family Medicine

## 2017-11-16 DIAGNOSIS — S99912D Unspecified injury of left ankle, subsequent encounter: Secondary | ICD-10-CM

## 2017-11-16 NOTE — Patient Instructions (Signed)
You have an ankle sprain. Ice the area for 15 minutes at a time, 3-4 times a day Aleve 2 tabs twice a day with food OR ibuprofen 3 tabs three times a day with food for pain and inflammation as needed. Elevate above the level of your heart when possible Use ankle brace when up and walking around to help with stability while you recover from this injury. Come out of the brace twice a day to do Up/down and alphabet exercises 2-3 sets of each. Start theraband exercises 3 sets of 10 once a day. Follow up in 4 weeks.

## 2017-11-16 NOTE — Progress Notes (Signed)
PCP: Donato SchultzLowne Chase, Yvonne R, DO  Subjective:   HPI: Patient is a 31 y.o. female here for left ankle injury.  12/6: Patient reports on 11/30 she stepped off the back of a truck and accidentally inverted her left ankle badly. Pain, swelling laterally. Has been wearing ASO and using crutches since. Pain 5/10 and sharp, worse with ambulation. Ibuprofen and tylenol help with the pain. Bruising but no other skin changes. No numbness. No prior left ankle injuries.  12/20: Patient reports she has improved since last visit. Swelling has improved. Not used ASO past few days. Taking ibuprofen, doing motion exercises, icing. Pain worse with standing a long time. Pain currently 5/10 at worst. No skin changes now, no numbness.  Past Medical History:  Diagnosis Date  . Back pain   . Obesity   . Persistent headaches     Current Outpatient Medications on File Prior to Visit  Medication Sig Dispense Refill  . acetaminophen (TYLENOL) 500 MG tablet Take 1 tablet (500 mg total) by mouth every 6 (six) hours as needed. 30 tablet 0  . cetirizine (ZYRTEC) 10 MG tablet Take 1 tablet by mouth daily.    . cyclobenzaprine (FLEXERIL) 10 MG tablet Take 1 tablet (10 mg total) by mouth 2 (two) times daily as needed for muscle spasms. 20 tablet 0  . diclofenac (VOLTAREN) 75 MG EC tablet   2  . etonogestrel (NEXPLANON) 68 MG IMPL implant 68 mg.    . phentermine (ADIPEX-P) 37.5 MG tablet Take 1 tablet (37.5 mg total) by mouth daily before breakfast. 30 tablet 0   No current facility-administered medications on file prior to visit.     Past Surgical History:  Procedure Laterality Date  . BARTHOLIN GLAND CYST EXCISION      No Known Allergies  Social History   Socioeconomic History  . Marital status: Single    Spouse name: Not on file  . Number of children: 1  . Years of education: College  . Highest education level: Not on file  Social Needs  . Financial resource strain: Not on file  . Food  insecurity - worry: Not on file  . Food insecurity - inability: Not on file  . Transportation needs - medical: Not on file  . Transportation needs - non-medical: Not on file  Occupational History    Employer: [employer]    Comment: [employer]  Tobacco Use  . Smoking status: Former Smoker    Types: Cigars  . Smokeless tobacco: Never Used  Substance and Sexual Activity  . Alcohol use: Yes  . Drug use: No  . Sexual activity: Yes    Birth control/protection: Implant  Other Topics Concern  . 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 She quit 3 years ago.     Alcohol Use:  None   Drug Use:  None   Diet:  Regular   Exercise:  None   Hobbies: Makeup, Traveling    Caffeine Use: 1-2 cups daily    Family History  Problem Relation Age of Onset  . Heart disease Mother   . Hypertension Mother   . Hyperlipidemia Mother   . Hypertension Sister   . Asthma  Daughter   . Breast cancer Maternal Grandmother     LMP 10/26/2017 (Exact Date)   Review of Systems: See HPI above.     Objective:  Physical Exam:  Gen: NAD, comfortable in exam room.  Left ankle: Mild lateral swelling, no bruising or other deformity. Mild limitation ROM all directions with 5/5 strength. TTP over ATFL.  No other tenderness. 1+ ant drawer and talar tilt. Negative syndesmotic compression. Thompsons test negative. NV intact distally.   Assessment & Plan:  1. Left ankle injury - Radiographs negative for fracture.  2/2 lateral ankle sprain.  Encouraged to wear ASO regularly.  Motion exercises and add theraband strengthening exercises.  Icing, aleve or ibuprofen.  Elevation.  F/u in 4 weeks.

## 2017-11-16 NOTE — Assessment & Plan Note (Signed)
Radiographs negative for fracture.  2/2 lateral ankle sprain.  Encouraged to wear ASO regularly.  Motion exercises and add theraband strengthening exercises.  Icing, aleve or ibuprofen.  Elevation.  F/u in 4 weeks.

## 2017-12-14 ENCOUNTER — Ambulatory Visit: Payer: Self-pay | Admitting: Family Medicine

## 2018-03-05 ENCOUNTER — Other Ambulatory Visit: Payer: Self-pay

## 2018-03-05 ENCOUNTER — Emergency Department (HOSPITAL_BASED_OUTPATIENT_CLINIC_OR_DEPARTMENT_OTHER)
Admission: EM | Admit: 2018-03-05 | Discharge: 2018-03-05 | Disposition: A | Discharge status: Home / Self Care | Payer: Managed Care, Other (non HMO) | Attending: Emergency Medicine | Admitting: Emergency Medicine

## 2018-03-05 ENCOUNTER — Encounter (HOSPITAL_BASED_OUTPATIENT_CLINIC_OR_DEPARTMENT_OTHER): Payer: Self-pay | Admitting: Emergency Medicine

## 2018-03-05 DIAGNOSIS — L03011 Cellulitis of right finger: Secondary | ICD-10-CM | POA: Diagnosis not present

## 2018-03-05 DIAGNOSIS — Z79899 Other long term (current) drug therapy: Secondary | ICD-10-CM | POA: Insufficient documentation

## 2018-03-05 DIAGNOSIS — R2231 Localized swelling, mass and lump, right upper limb: Secondary | ICD-10-CM | POA: Diagnosis not present

## 2018-03-05 DIAGNOSIS — Z87891 Personal history of nicotine dependence: Secondary | ICD-10-CM | POA: Diagnosis not present

## 2018-03-05 DIAGNOSIS — M79644 Pain in right finger(s): Secondary | ICD-10-CM | POA: Diagnosis present

## 2018-03-05 DIAGNOSIS — L03019 Cellulitis of unspecified finger: Secondary | ICD-10-CM

## 2018-03-05 MED ORDER — CLINDAMYCIN HCL 300 MG PO CAPS: 300.0000 mg | ORAL_CAPSULE | Freq: Three times a day (TID) | ORAL | 0 refills | Status: DC

## 2018-03-05 MED ORDER — LIDOCAINE HCL (PF) 1 % IJ SOLN
5.0000 mL | Freq: Once | INTRAMUSCULAR | Status: AC
Start: 2018-03-05 — End: 2018-03-05
  Administered 2018-03-05: 5 mL
  Filled 2018-03-05: qty 5

## 2018-03-05 MED ORDER — IBUPROFEN 600 MG PO TABS: 600.0000 mg | ORAL_TABLET | Freq: Four times a day (QID) | ORAL | 0 refills | Status: DC | PRN

## 2018-03-05 MED ORDER — BUPIVACAINE HCL (PF) 0.5 % IJ SOLN
10.0000 mL | Freq: Once | INTRAMUSCULAR | Status: AC
  Administered 2018-03-05: 10 mL
  Filled 2018-03-05: qty 10

## 2018-03-05 MED ORDER — OXYCODONE-ACETAMINOPHEN 5-325 MG PO TABS: 1.0000 | ORAL_TABLET | ORAL | 0 refills | Status: DC | PRN

## 2018-03-05 MED ORDER — CLINDAMYCIN HCL 150 MG PO CAPS
600.0000 mg | ORAL_CAPSULE | Freq: Once | ORAL | Status: AC
  Administered 2018-03-05: 600 mg via ORAL
  Filled 2018-03-05: qty 4

## 2018-03-05 MED FILL — CLINDAMYCIN HCL 300 MG CAP: 300 | 7 days supply | Qty: 21 | Fill #0

## 2018-03-05 MED FILL — IBUPROFEN 600 MG TABLET: 600 | 8 days supply | Qty: 30 | Fill #0

## 2018-03-05 MED FILL — OXYCODONE-ACETAMINOPHEN 5-3: 5-325 | 2 days supply | Qty: 12 | Fill #0

## 2018-03-05 NOTE — Discharge Instructions (Signed)
Soak your thumb in warm water for 5-10 minutes 3 times per day. Otherwise keep if clean and dry. Keeping your hand elevated will help with the throbbing sensation. Take pain medication as needed. You may need to have a hand surgeon open this up more as I am concerned you are developing a condition called a felon.

## 2018-03-05 NOTE — ED Provider Notes (Signed)
[facility] EMERGENCY DEPARTMENT Provider Note   CSN: [id number] Arrival date & time: 03/05/18  0737     History   Chief Complaint Chief Complaint  Patient presents with  . Thumb pain    HPI Joan Harris is a 32 y.o. female.  HPI   32 year old female with right thumb pain.  Onset about a week ago.  Progressively worsening pain and swelling.  She states that it throbs.  She denies any trauma.  No fevers or chills.  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  She denies biting her nails.  Past Medical History:  Diagnosis Date  . Back pain   . Obesity   . Persistent headaches     Patient Active Problem List   Diagnosis Date Noted  . Left ankle injury, subsequent encounter 11/06/2017  . Chronic LBP 08/17/2015  . Acanthosis nigricans 06/26/2013  . Other malaise and fatigue 06/26/2013  . Obesity, morbid (HCC) 06/26/2013  . GERD (gastroesophageal reflux disease) 06/26/2013  . Headaches, cluster 03/14/2013    Past Surgical History:  Procedure Laterality Date  . BARTHOLIN GLAND CYST EXCISION       OB History   None      Home Medications    Prior to Admission medications   Medication Sig Start Date End Date Taking? Authorizing Provider  acetaminophen (TYLENOL) 500 MG tablet Take 1 tablet (500 mg total) by mouth every 6 (six) hours as needed. 10/28/17   Law, Waylan Boga, PA-C  cetirizine (ZYRTEC) 10 MG tablet Take 1 tablet by mouth daily. 01/16/13   [provider]  cyclobenzaprine (FLEXERIL) 10 MG tablet Take 1 tablet (10 mg total) by mouth 2 (two) times daily as needed for muscle spasms. 03/26/17   Gwyneth Sprout, MD  diclofenac (VOLTAREN) 75 MG EC tablet  04/04/17   [provider]  etonogestrel (NEXPLANON) 68 MG IMPL implant 68 mg. 12/15/14   [provider]  phentermine (ADIPEX-P) 37.5 MG tablet Take 1 tablet (37.5 mg total) by mouth daily before breakfast. 05/22/17   Donato Schultz,  DO    Family History Family History  Problem Relation Age of Onset  . Heart disease Mother   . Hypertension Mother   . Hyperlipidemia Mother   . Hypertension Sister   . Asthma Daughter   . Breast cancer Maternal Grandmother     Social History Social History   Tobacco Use  . Smoking status: Former Smoker    Types: Cigars  . Smokeless tobacco: Never Used  Substance Use Topics  . Alcohol use: Yes    Comment: special occasions  . Drug use: No     Allergies   Patient has no known allergies.   Review of Systems Review of Systems  All systems reviewed and negative, other than as noted in HPI.  Physical Exam Updated Vital Signs BP 140/86 (BP Location: Right Arm)   Pulse 90   Temp 98.6 F (37 C) (Oral)   Resp 18   Ht 5\' 10"  (1.778 m)   Wt 116.1 kg (256 lb)   LMP 01/17/2018 (Approximate)   SpO2 98%   BMI 36.73 kg/m   Physical Exam  Constitutional: She appears well-developed and well-nourished. No distress.  HENT:  Head: Normocephalic and atraumatic.  Eyes: Conjunctivae are normal. Right eye exhibits no discharge. Left eye exhibits no discharge.  Neck: Neck supple.  Cardiovascular: Normal rate, regular rhythm and normal heart sounds. Exam reveals no gallop and  no friction rub.  No murmur heard. Pulmonary/Chest: Effort normal and breath sounds normal. No respiratory distress.  Abdominal: Soft. She exhibits no distension. There is no tenderness.  Musculoskeletal: She exhibits edema and tenderness.  Mild swelling of the distal right thumb.  There is some crusting drainage of the nail fold along the radial aspect of the thumb.  There is some swelling, erythema and tenderness extending laterally and a little bit into the pad of the finger.  The pad itself is not tense.  The swelling does not extend into the interphalangeal joint.  She can actively extend and extend at the interphalangeal joint.  Neurological: She is alert.  Skin: Skin is warm and dry.  Psychiatric:  She has a normal mood and affect. Her behavior is normal. Thought content normal.  Nursing note and vitals reviewed.    ED Treatments / Results  Labs (all labs ordered are listed, but only abnormal results are displayed) Labs Reviewed - No data to display  EKG None  Radiology No results found.  Procedures Procedures (including critical care time)  INCISION AND DRAINAGE Performed by: Raeford RazorStephen Axyl Sitzman Consent: Verbal consent obtained. Risks and benefits: risks, benefits and alternatives were discussed Type: abscess  Body area: Right thumb Incision was made with a scalpel.  Anesthesia: Digital block with 1-1 1% lidocaine and 0.5% Marcaine both without epinephrine.  Anesthetic total: 3 ml  Complexity: complex Blunt dissection to break up loculations  Drainage: purulent  Drainage amount: moderate   Patient tolerance: Patient tolerated the procedure well with no immediate complications.     Medications Ordered in ED Medications  lidocaine (PF) (XYLOCAINE) 1 % injection 5 mL (5 mLs Other Given 03/05/18 0803)  bupivacaine (MARCAINE) 0.5 % injection 10 mL (10 mLs Infiltration Given 03/05/18 0802)  clindamycin (CLEOCIN) capsule 600 mg (600 mg Oral Given 03/05/18 0801)     Initial Impression / Assessment and Plan / ED Course  I have reviewed the triage vital signs and the nursing notes.  Pertinent labs & imaging results that were available during my care of the patient were reviewed by me and considered in my medical decision making (see chart for details).     Pt with a paronychia.  There was some crusting drainage at the radial side of the nail fold. I did a digital block and elevated it with a scalpel. I was satisfied that I was able to get a considerable amount of pus drained.  There is some tenderness in the pad of the thumb but it is soft. Hopefully this drainage will be sufficient. Will place her on abx for possibility of developing felon. First dose was given in the  ED. Warm soaks. Elevation. Needs to follow-up with hand surgery this week if symptoms not improving.  Final Clinical Impressions(s) / ED Diagnoses   Final diagnoses:  None    ED Discharge Orders    None       Raeford RazorKohut, Nemesio Castrillon, MD 03/06/18 [phone]

## 2018-03-05 NOTE — ED Triage Notes (Signed)
Swelling, pain, and visible pus pocket around thumb nail on right hand.  Started over a week ago.  Pain to the point of not sleeping now.

## 2018-03-14 MED FILL — FLUCONAZOLE 150 MG TABS: 150 | 1 days supply | Qty: 1 | Fill #0

## 2018-03-28 MED FILL — FLUCONAZOLE 150 MG TABS: 150 | 1 days supply | Qty: 1 | Fill #1

## 2018-04-05 MED FILL — metroNIDAZOLE 500 MG TABS: 500 | 7 days supply | Qty: 14 | Fill #0

## 2018-06-25 MED FILL — metroNIDAZOLE 500 MG TABS: 500 | 7 days supply | Qty: 14 | Fill #0

## 2018-11-07 IMAGING — CR DG ANKLE COMPLETE 3+V*L*
3 series · 3 of 3 positions shown · non-contrast
Comparison: None.

CLINICAL DATA: Ankle injury yesterday with persistent ankle/ foot
pain.

EXAM:
LEFT ANKLE COMPLETE - 3+ VIEW

[t ankle joint ap left]
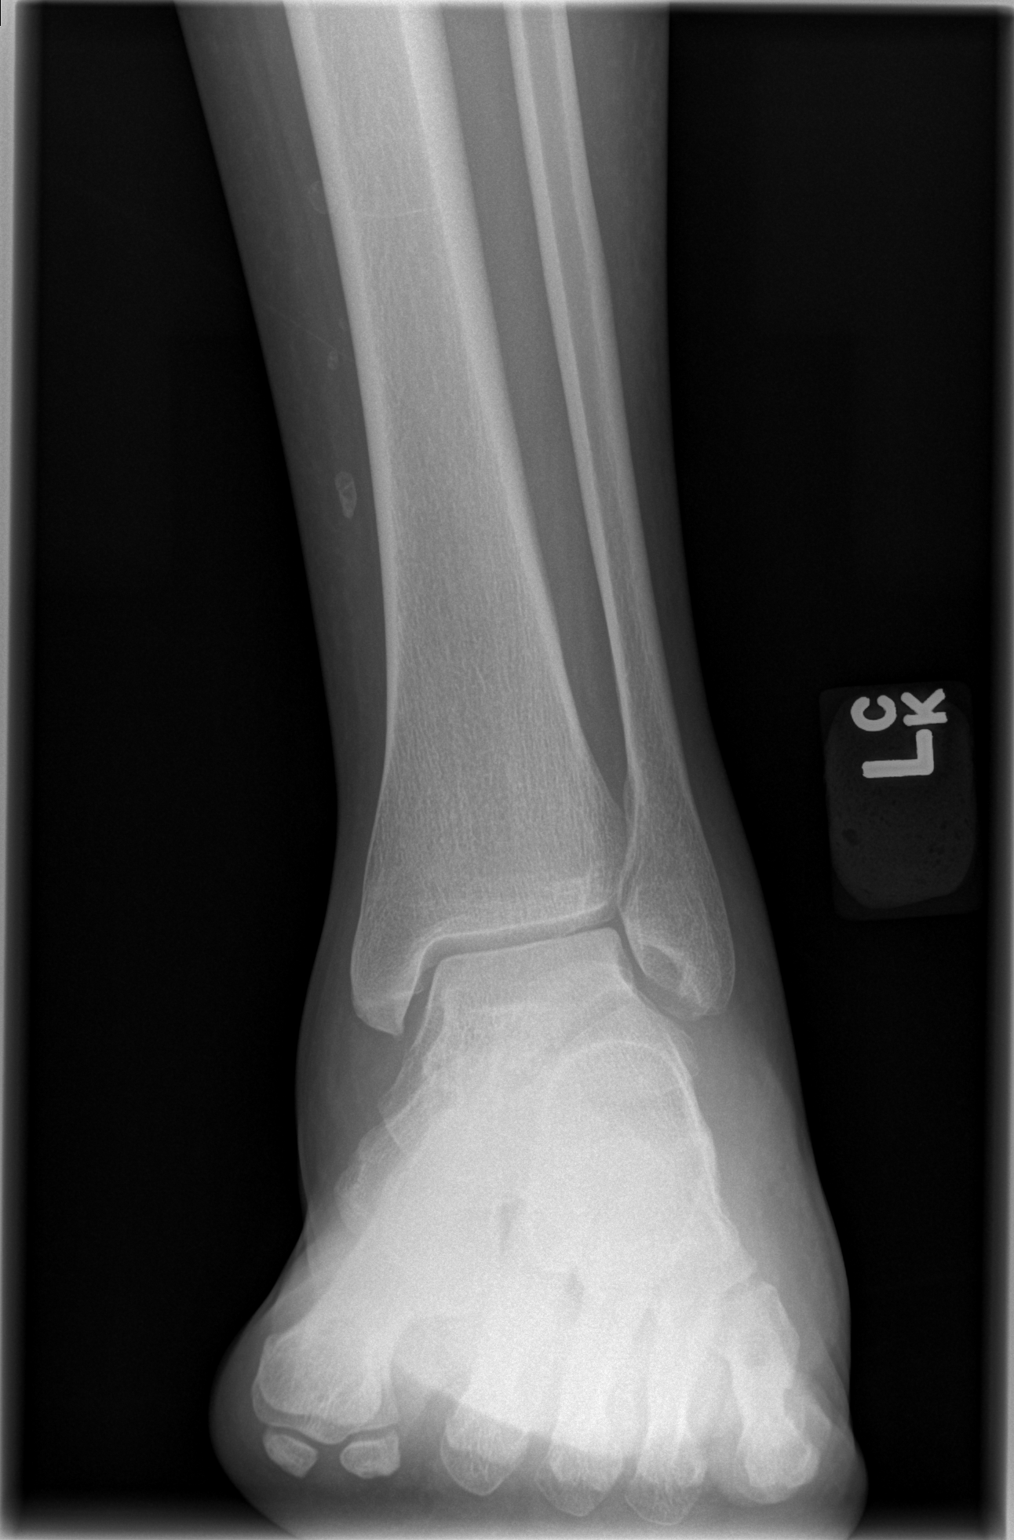

[t ankle joint oblique left]
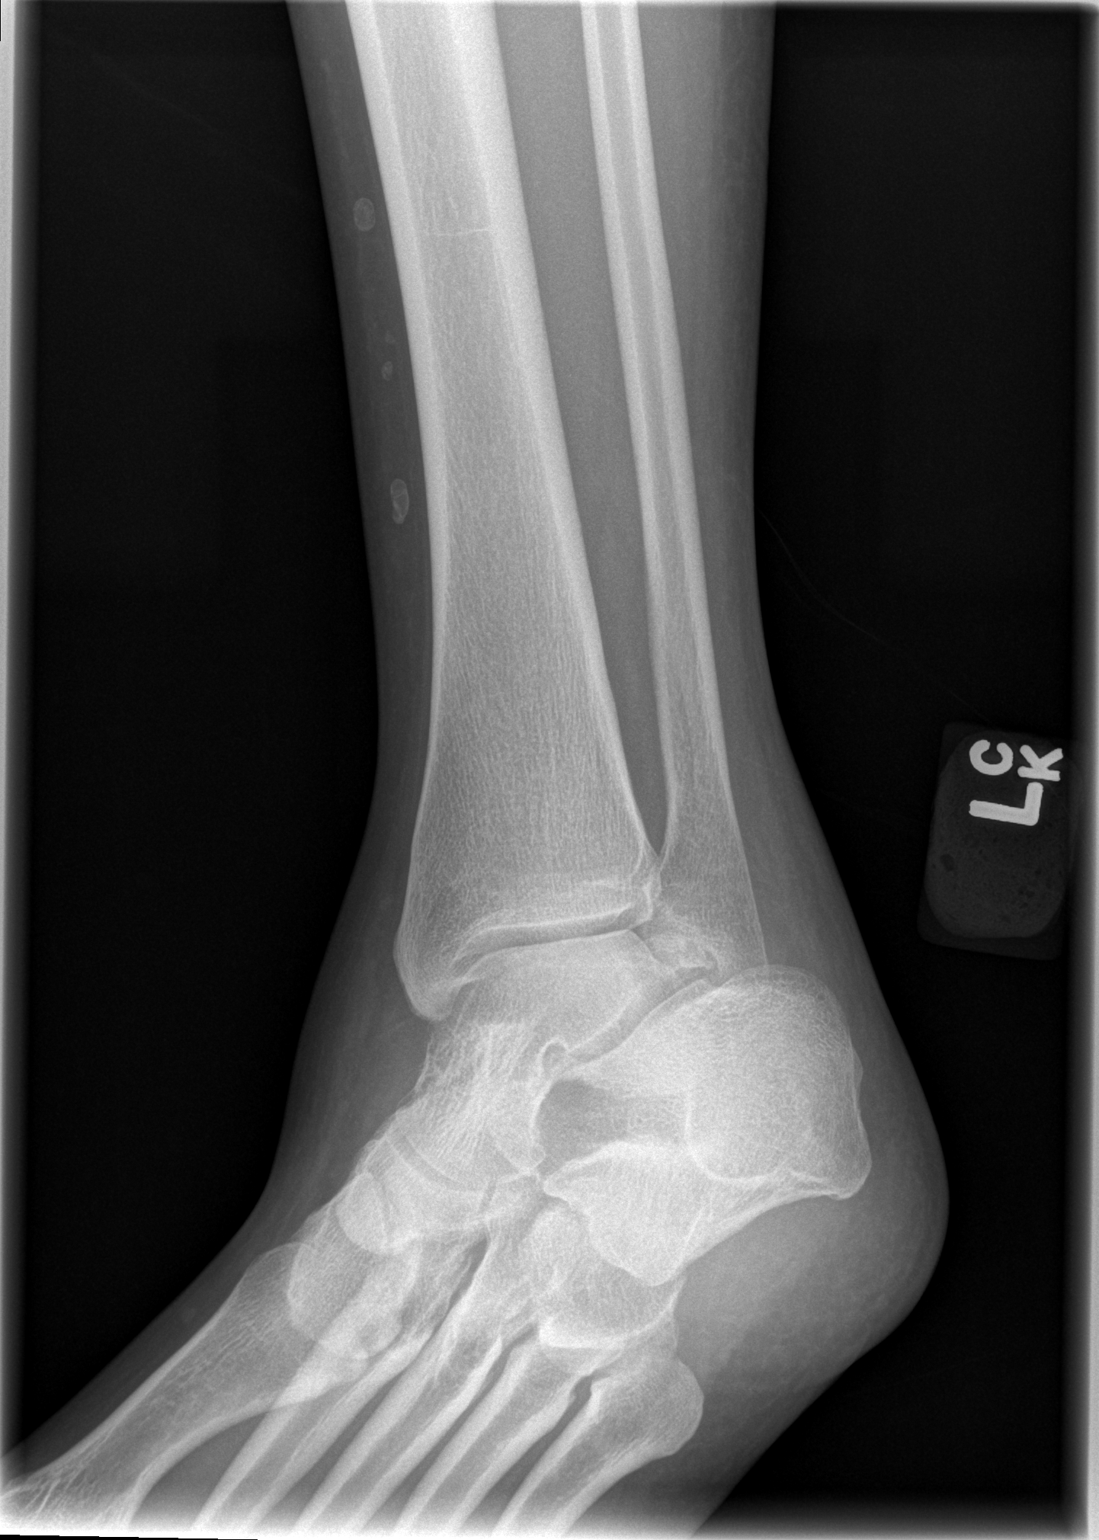

[t ankle joint lat left]
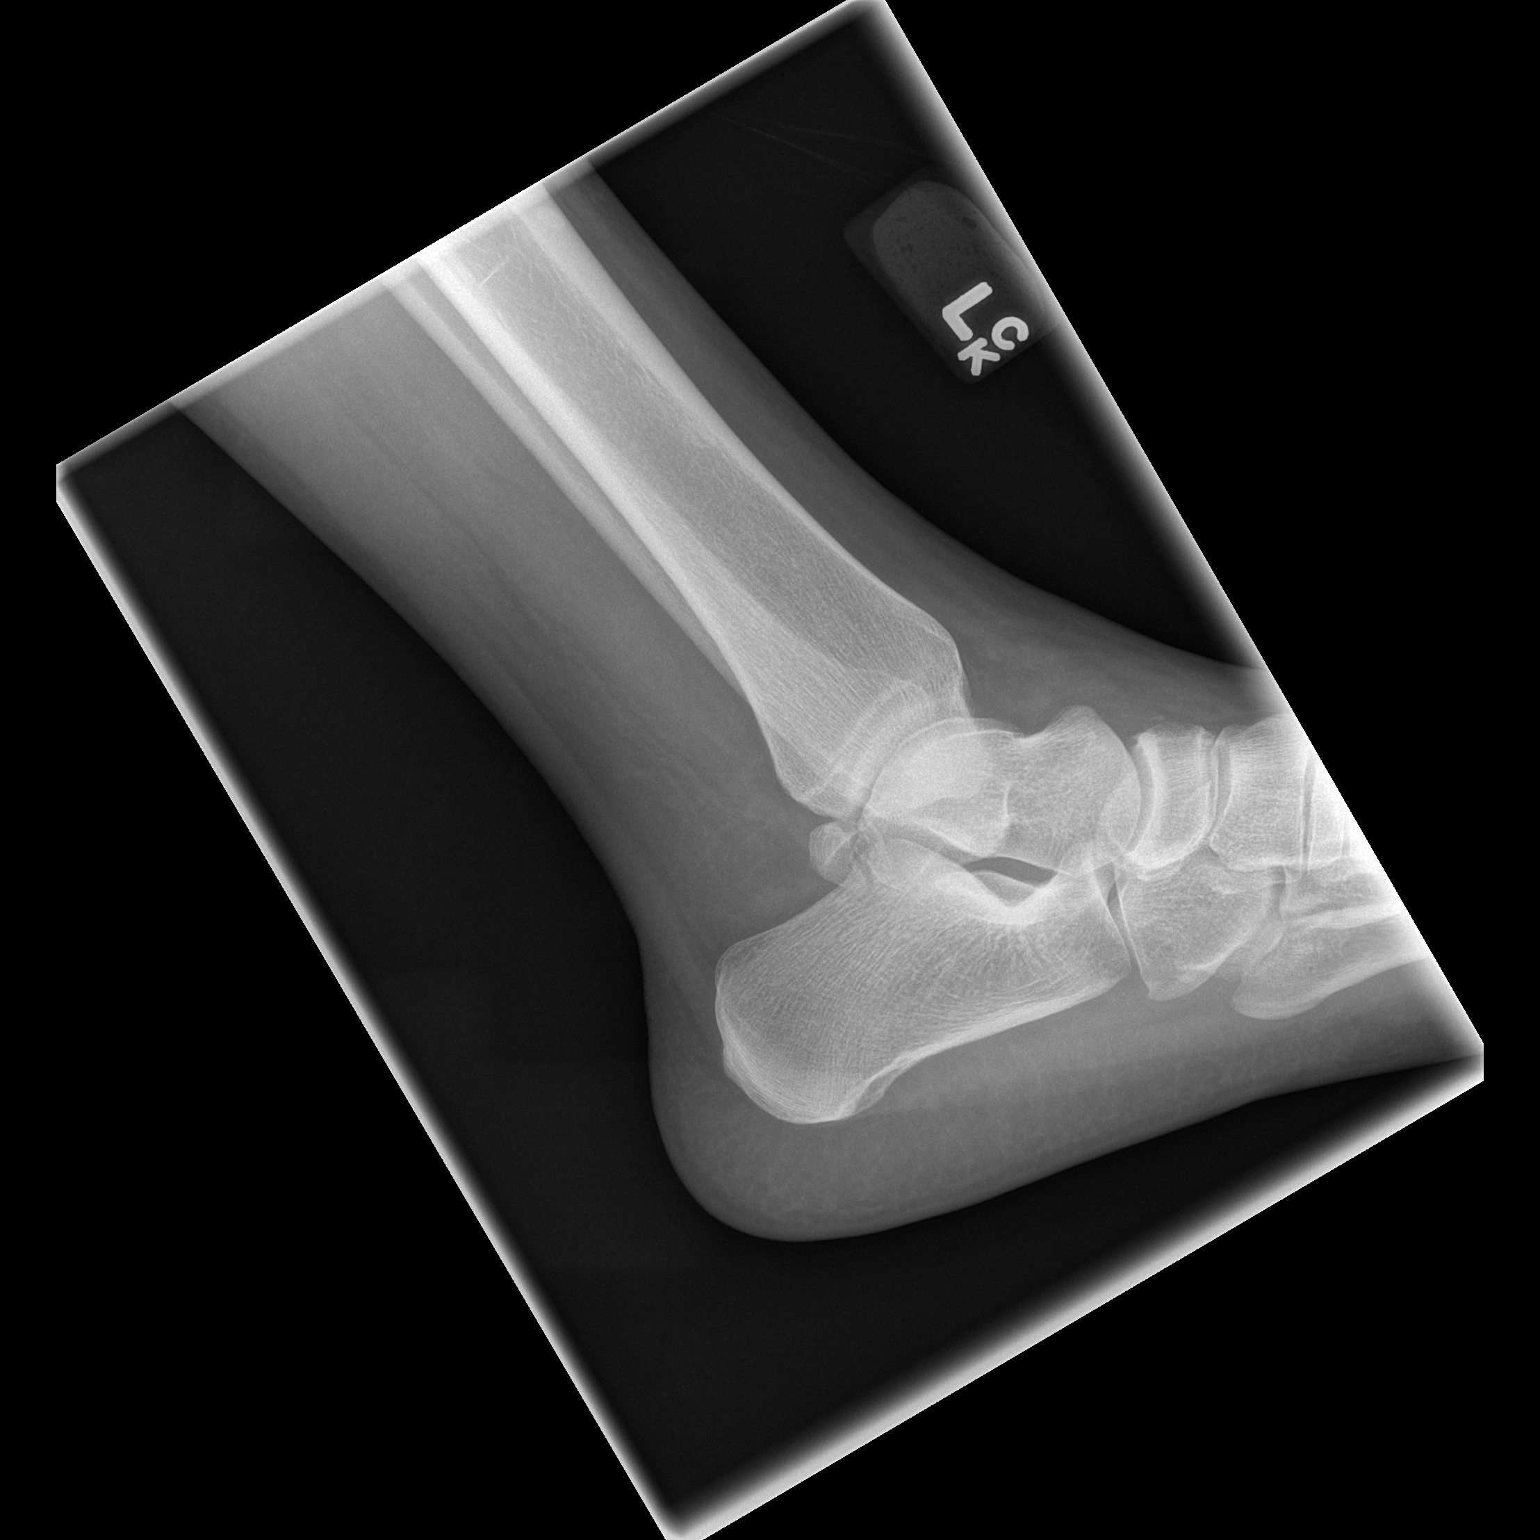

[3 of 3 positions shown; findings below may reference images not displayed]

FINDINGS: There is no evidence of fracture, dislocation, or joint effusion.
There is no evidence of arthropathy or other focal bone abnormality.
Soft tissues are unremarkable.
IMPRESSION: Negative.

## 2018-11-07 IMAGING — CR DG FOOT COMPLETE 3+V*L*
3 series · 3 of 3 positions shown · non-contrast
Comparison: None.

CLINICAL DATA: Ankle injury yesterday with persistent ankle/ foot
pain.

EXAM:
LEFT FOOT - COMPLETE 3+ VIEW

[t foot ap left]
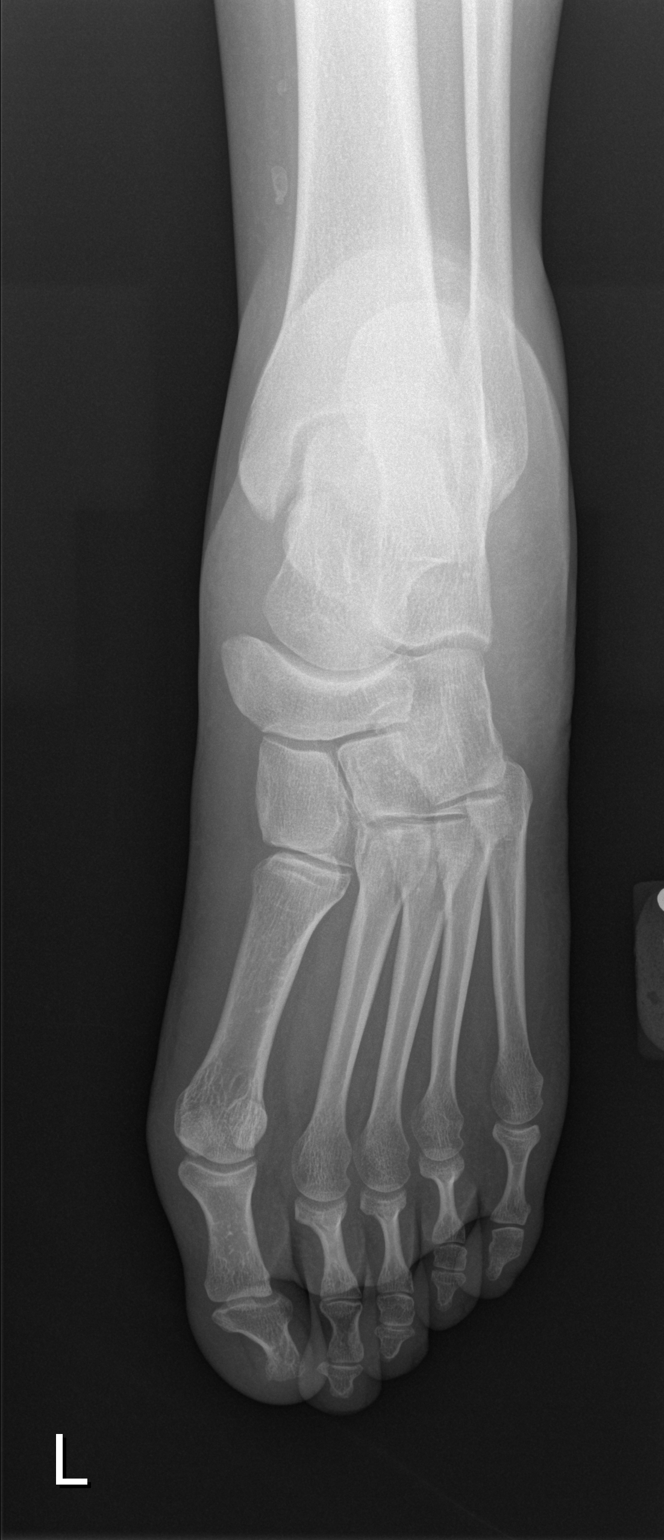

[t foot oblique left]
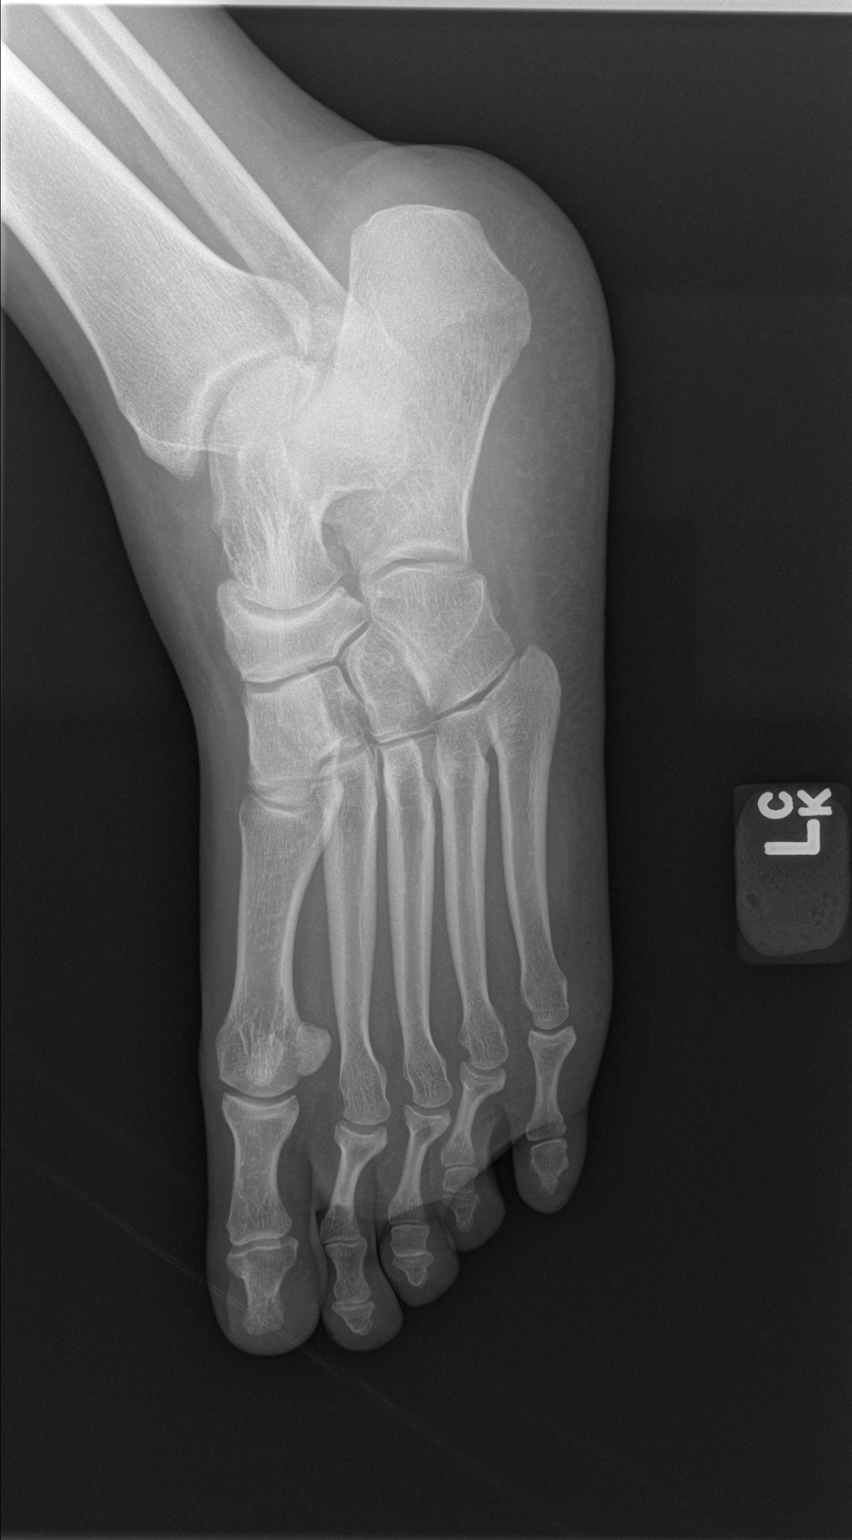

[t foot lat left]
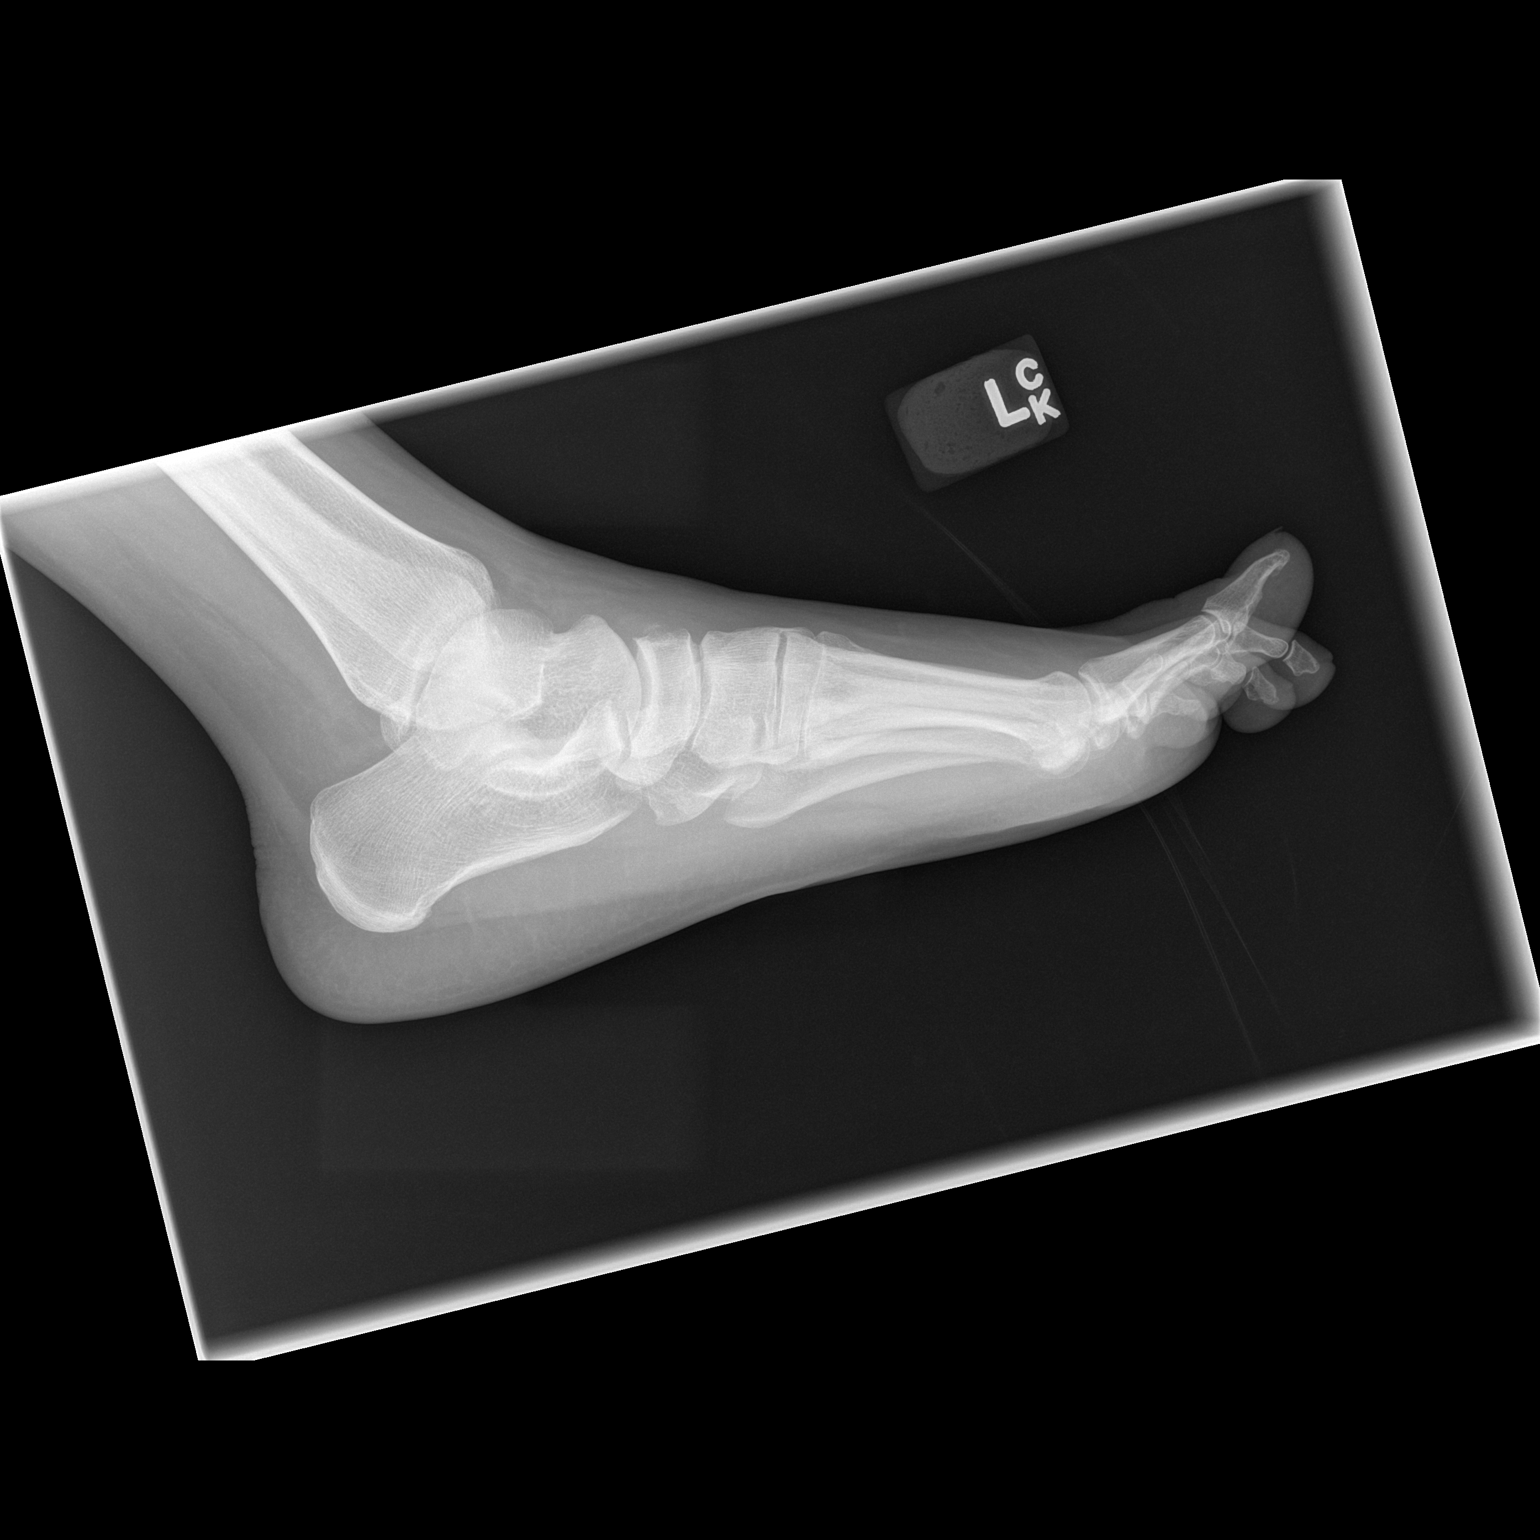

[3 of 3 positions shown; findings below may reference images not displayed]

FINDINGS: There is no evidence of fracture or dislocation. There is no
evidence of arthropathy or other focal bone abnormality. Soft
tissues are unremarkable.
IMPRESSION: Negative.

## 2018-12-20 MED FILL — IBUPROFEN 800 MG TAB: 800 | 8 days supply | Qty: 30 | Fill #0

## 2019-01-21 ENCOUNTER — Ambulatory Visit (INDEPENDENT_AMBULATORY_CARE_PROVIDER_SITE_OTHER): Payer: Managed Care, Other (non HMO) | Admitting: Family Medicine

## 2019-01-21 ENCOUNTER — Encounter: Payer: Self-pay | Admitting: Family Medicine

## 2019-01-21 DIAGNOSIS — Z23 Encounter for immunization: Secondary | ICD-10-CM

## 2019-01-21 DIAGNOSIS — Z114 Encounter for screening for human immunodeficiency virus [HIV]: Secondary | ICD-10-CM

## 2019-01-21 DIAGNOSIS — R202 Paresthesia of skin: Secondary | ICD-10-CM

## 2019-01-21 DIAGNOSIS — R200 Anesthesia of skin: Secondary | ICD-10-CM

## 2019-01-21 LAB — CBC WITH DIFFERENTIAL/PLATELET
BASOS ABS: 0 10*3/uL (ref 0.0–0.1)
Basophils Relative: 0.5 % (ref 0.0–3.0)
Eosinophils Absolute: 0.1 10*3/uL (ref 0.0–0.7)
Eosinophils Relative: 1.6 % (ref 0.0–5.0)
HCT: 35.3 % — ABNORMAL LOW (ref 36.0–46.0)
HEMOGLOBIN: 11.9 g/dL — AB (ref 12.0–15.0)
Lymphocytes Relative: 54.7 % — ABNORMAL HIGH (ref 12.0–46.0)
Lymphs Abs: 3.3 10*3/uL (ref 0.7–4.0)
MCHC: 33.7 g/dL (ref 30.0–36.0)
MCV: 90.5 fl (ref 78.0–100.0)
MONOS PCT: 7.2 % (ref 3.0–12.0)
Monocytes Absolute: 0.4 10*3/uL (ref 0.1–1.0)
Neutro Abs: 2.2 10*3/uL (ref 1.4–7.7)
Neutrophils Relative %: 36 % — ABNORMAL LOW (ref 43.0–77.0)
Platelets: 328 10*3/uL (ref 150.0–400.0)
RBC: 3.91 Mil/uL (ref 3.87–5.11)
RDW: 14.7 % (ref 11.5–15.5)
WBC: 6 10*3/uL (ref 4.0–10.5)

## 2019-01-21 LAB — COMPREHENSIVE METABOLIC PANEL
ALBUMIN: 3.9 g/dL (ref 3.5–5.2)
ALK PHOS: 57 U/L (ref 39–117)
ALT: 22 U/L (ref 0–35)
AST: 19 U/L (ref 0–37)
BILIRUBIN TOTAL: 0.3 mg/dL (ref 0.2–1.2)
BUN: 10 mg/dL (ref 6–23)
CALCIUM: 8.8 mg/dL (ref 8.4–10.5)
CO2: 28 mEq/L (ref 19–32)
Chloride: 103 mEq/L (ref 96–112)
Creatinine, Ser: 0.74 mg/dL (ref 0.40–1.20)
GFR: 108.42 mL/min (ref >60.00)
GLUCOSE: 74 mg/dL (ref 70–99)
Potassium: 3.8 mEq/L (ref 3.5–5.1)
Sodium: 138 mEq/L (ref 135–145)
TOTAL PROTEIN: 6.9 g/dL (ref 6.0–8.3)

## 2019-01-21 LAB — LIPID PANEL
CHOL/HDL RATIO: 2
CHOLESTEROL: 165 mg/dL (ref 0–200)
HDL: 70.1 mg/dL (ref >39.00)
LDL CALC: 81 mg/dL (ref 0–99)
NonHDL: 95.36
Triglycerides: 74 mg/dL (ref 0.0–149.0)
VLDL: 14.8 mg/dL (ref 0.0–40.0)

## 2019-01-21 LAB — TSH: TSH: 1.02 u[IU]/mL (ref 0.35–4.50)

## 2019-01-21 MED ORDER — LIRAGLUTIDE -WEIGHT MANAGEMENT 18 MG/3ML ~~LOC~~ SOPN: 3.0000 mL | PEN_INJECTOR | Freq: Every day | SUBCUTANEOUS | 3 refills | Status: DC

## 2019-01-21 MED FILL — CLINDAMYCIN PHOS-BENZOYL PE: 1-5 | 30 days supply | Qty: 50 | Fill #0

## 2019-01-21 MED FILL — metroNIDAZOLE 500 MG TABS: 500 | 7 days supply | Qty: 14 | Fill #0

## 2019-01-21 NOTE — Patient Instructions (Addendum)
[facility] Instructions Week 1 ? 0.6 mg [place] once daily x 7 days.  Week 2 ? 1.2 mg [place] once daily x 7 days.  Week 3 ? 1.8 mg [place] once daily x 7 days.  Week 4 ? 2.4 mg [place] once daily x 7 days. Week 5 ? 3.0 mg [place] once daily x 7 days.   Exercising to Lose Weight Exercise is structured, repetitive physical activity to improve fitness and health. Getting regular exercise is important for everyone. It is especially important if you are overweight. Being overweight increases your risk of heart disease, stroke, diabetes, high blood pressure, and several types of cancer. Reducing your calorie intake and exercising can help you lose weight. Exercise is usually categorized as moderate or vigorous intensity. To lose weight, most people need to do a certain amount of moderate-intensity or vigorous-intensity exercise each week. Moderate-intensity exercise  Moderate-intensity exercise is any activity that gets you moving enough to burn at least three times more energy (calories) than if you were sitting. Examples of moderate exercise include:  Walking a mile in 15 minutes.  Doing light yard work.  Biking at an easy pace. Most people should get at least 150 minutes (2 hours and 30 minutes) a week of moderate-intensity exercise to maintain their body weight. Vigorous-intensity exercise Vigorous-intensity exercise is any activity that gets you moving enough to burn at least six times more calories than if you were sitting. When you exercise at this intensity, you should be working hard enough that you are not able to carry on a conversation. Examples of vigorous exercise include:  Running.  Playing a team sport, such as football, basketball, and soccer.  Jumping rope. Most people should get at least 75 minutes (1 hour and 15 minutes) a week of vigorous-intensity exercise to maintain their body weight. How can exercise affect me? When you exercise enough to burn more calories than you eat, you lose weight.  Exercise also reduces body fat and builds muscle. The more muscle you have, the more calories you burn. Exercise also:  Improves mood.  Reduces stress and tension.  Improves your overall fitness, flexibility, and endurance.  Increases bone strength. The amount of exercise you need to lose weight depends on:  Your age.  The type of exercise.  Any health conditions you have.  Your overall physical ability. Talk to your health care provider about how much exercise you need and what types of activities are safe for you. What actions can I take to lose weight? Nutrition   Make changes to your diet as told by your health care provider or diet and nutrition specialist (dietitian). This may include: ? Eating fewer calories. ? Eating more protein. ? Eating less unhealthy fats. ? Eating a diet that includes fresh fruits and vegetables, whole grains, low-fat dairy products, and lean protein. ? Avoiding foods with added fat, salt, and sugar.  Drink plenty of water while you exercise to prevent dehydration or heat stroke. Activity  Choose an activity that you enjoy and set realistic goals. Your health care provider can help you make an exercise plan that works for you.  Exercise at a moderate or vigorous intensity most days of the week. ? The intensity of exercise may vary from person to person. You can tell how intense a workout is for you by paying attention to your breathing and heartbeat. Most people will notice their breathing and heartbeat get faster with more intense exercise.  Do resistance training twice each week, such  as: ? Push-ups. ? Sit-ups. ? Lifting weights. ? Using resistance bands.  Getting short amounts of exercise can be just as helpful as long structured periods of exercise. If you have trouble finding time to exercise, try to include exercise in your daily routine. ? Get up, stretch, and walk around every 30 minutes throughout the day. ? Go for a walk during  your lunch break. ? Park your car farther away from your destination. ? If you take public transportation, get off one stop early and walk the rest of the way. ? Make phone calls while standing up and walking around. ? Take the stairs instead of elevators or escalators.  Wear comfortable clothes and shoes with good support.  Do not exercise so much that you hurt yourself, feel dizzy, or get very short of breath. Where to find more information  U.[place]. Department of Health and Human Services: ThisPath.fi  Centers for Disease Control and Prevention (CDC): FootballExhibition.com.br Contact a health care provider:  Before starting a new exercise program.  If you have questions or concerns about your weight.  If you have a medical problem that keeps you from exercising. Get help right away if you have any of the following while exercising:  Injury.  Dizziness.  Difficulty breathing or shortness of breath that does not go away when you stop exercising.  Chest pain.  Rapid heartbeat. Summary  Being overweight increases your risk of heart disease, stroke, diabetes, high blood pressure, and several types of cancer.  Losing weight happens when you burn more calories than you eat.  Reducing the amount of calories you eat in addition to getting regular moderate or vigorous exercise each week helps you lose weight. This information is not intended to replace advice given to you by your health care provider. Make sure you discuss any questions you have with your health care provider. Document Released: 12/17/2010 Document Revised: 11/27/2017 Document Reviewed: 11/27/2017 Elsevier Interactive Patient Education  2019 [employer].

## 2019-01-21 NOTE — Assessment & Plan Note (Signed)
Pt referred to [facility] rx but pt aware her ins may not cover

## 2019-01-21 NOTE — Progress Notes (Signed)
Patient ID: Joan Harris, female    DOB: 01-05-1986  Age: 33 y.o. MRN: [id number]    Subjective:  Subjective  HPI Joan Harris presents for f/u weight.  She was last seen in 2018---she has gained 40 lbs since then.  She is doing weight watchers but admits to not following the plan lately.    Review of Systems  Constitutional: Negative for appetite change, diaphoresis, fatigue and unexpected weight change.  Eyes: Negative for pain, redness and visual disturbance.  Respiratory: Negative for cough, chest tightness, shortness of breath and wheezing.   Cardiovascular: Negative for chest pain, palpitations and leg swelling.  Endocrine: Negative for cold intolerance, heat intolerance, polydipsia, polyphagia and polyuria.  Genitourinary: Negative for difficulty urinating, dysuria and frequency.  Neurological: Negative for dizziness, light-headedness, numbness and headaches.    History Past Medical History:  Diagnosis Date  . Back pain   . Obesity   . Persistent headaches     She has a past surgical history that includes Bartholin gland cyst excision.   Her family history includes Asthma in her daughter; Breast cancer in her maternal grandmother; Heart disease in her mother; Hyperlipidemia in her mother; Hypertension in her mother and sister.She reports that she has quit smoking. Her smoking use included cigars. She has never used smokeless tobacco. She reports current alcohol use. She reports that she does not use drugs.  Current Outpatient Medications on File Prior to Visit  Medication Sig Dispense Refill  . etonogestrel (NEXPLANON) 68 MG IMPL implant 68 mg.    . ibuprofen (ADVIL,MOTRIN) 600 MG tablet Take 1 tablet (600 mg total) by mouth every 6 (six) hours as needed. 30 tablet 0   No current facility-administered medications on file prior to visit.      Objective:  Objective  Physical Exam Vitals signs and nursing note reviewed.  Constitutional:      Appearance: She is  well-developed.  HENT:     Head: Normocephalic and atraumatic.  Eyes:     Conjunctiva/sclera: Conjunctivae normal.  Neck:     Musculoskeletal: Normal range of motion and neck supple.     Thyroid: No thyromegaly.     Vascular: No carotid bruit or JVD.  Cardiovascular:     Rate and Rhythm: Normal rate and regular rhythm.     Heart sounds: Normal heart sounds. No murmur.  Pulmonary:     Effort: Pulmonary effort is normal. No respiratory distress.     Breath sounds: Normal breath sounds. No wheezing or rales.  Chest:     Chest wall: No tenderness.  Neurological:     Mental Status: She is alert and oriented to person, place, and time.    BP 128/84   Pulse 86   Ht 5\' 10"  (1.778 m)   Wt 280 lb (127 kg)   SpO2 94%   BMI 40.18 kg/m  Wt Readings from Last 3 Encounters:  01/21/19 280 lb (127 kg)  03/05/18 256 lb (116.1 kg)  11/02/17 250 lb (113.4 kg)     Lab Results  Component Value Date   WBC 8.5 05/03/2016   HGB 12.0 05/03/2016   HCT 36.1 05/03/2016   PLT 376 05/03/2016   GLUCOSE 91 05/03/2016   CHOL 153 08/17/2015   TRIG 78.0 08/17/2015   HDL 46.30 08/17/2015   LDLCALC 91 08/17/2015   ALT 29 05/03/2016   AST 30 05/03/2016   NA 139 05/03/2016   K 3.4 (L) 05/03/2016   CL 104 05/03/2016   CREATININE 0.88 05/03/2016  BUN 10 05/03/2016   CO2 27 05/03/2016   TSH 0.85 08/17/2015   HGBA1C 6.0 (H) 07/22/2014    No results found.   Assessment & Plan:  Plan  I have discontinued Joan Harris's cetirizine, cyclobenzaprine, diclofenac, phentermine, acetaminophen, clindamycin, and oxyCODONE-acetaminophen. I am also having her start on Liraglutide -Weight Management. Additionally, I am having her maintain her etonogestrel and ibuprofen.  Meds ordered this encounter  Medications  . Liraglutide -Weight Management (SAXENDA) 18 MG/3ML SOPN    Sig: Inject 3 mLs into the skin daily.    Dispense:  15 mL    Refill:  3    Problem List Items Addressed This Visit       Unprioritized   Obesity, morbid (HCC)    Pt referred to healthy weight and wellness saxenda rx but pt aware her ins may not cover       Relevant Medications   Liraglutide -Weight Management (SAXENDA) 18 MG/3ML SOPN    Other Visit Diagnoses    Morbid obesity (HCC)    -  Primary   Relevant Medications   Liraglutide -Weight Management (SAXENDA) 18 MG/3ML SOPN   Other Relevant Orders   TSH   Lipid panel   CBC with Differential/Platelet   Comprehensive metabolic panel   Amb Ref to Medical Weight Management   Numbness and tingling in left hand       Relevant Orders   Ambulatory referral to Orthopedic Surgery   Encounter for screening for HIV       Relevant Orders   HIV Antibody (routine testing w rflx)   Need for Tdap vaccination       Relevant Orders   Tdap vaccine greater than or equal to 7yo IM (Completed)      Follow-up: Return in about 3 months (around 04/21/2019), or if symptoms worsen or fail to improve, for weight loss.  Donato Schultz, DO

## 2019-01-22 LAB — HIV ANTIBODY (ROUTINE TESTING W REFLEX): HIV 1&2 Ab, 4th Generation: NONREACTIVE

## 2019-01-23 ENCOUNTER — Encounter: Payer: Self-pay | Admitting: *Deleted

## 2019-02-01 MED FILL — FLUCONAZOLE 150 MG TABS: 150 | 1 days supply | Qty: 1 | Fill #2

## 2019-02-06 MED FILL — FLUCONAZOLE 150 MG TABS: 150 | 4 days supply | Qty: 2 | Fill #0

## 2019-04-26 ENCOUNTER — Ambulatory Visit: Payer: Self-pay | Admitting: Family Medicine

## 2019-06-11 MED FILL — METRONIDAZOLE 500 MG TABS: 500 | 7 days supply | Qty: 14 | Fill #0

## 2019-09-11 MED FILL — IBUPROFEN 800 MG TAB: 800 | 23 days supply | Qty: 90 | Fill #0

## 2019-09-12 MED FILL — IBUPROFEN 200 MG TABLET: 200 | 6 days supply | Qty: 100 | Fill #0

## 2020-01-31 ENCOUNTER — Ambulatory Visit: Payer: Managed Care, Other (non HMO) | Attending: Internal Medicine

## 2020-01-31 DIAGNOSIS — Z23 Encounter for immunization: Secondary | ICD-10-CM

## 2020-01-31 NOTE — Progress Notes (Signed)
   Covid-19 Vaccination Clinic  Name:  Joan Harris    MRN: [id number] DOB: [birth year]  01/31/2020  [name] was observed post Covid-19 immunization for 15 minutes without incident. She was provided with Vaccine Information Sheet and instruction to access the V-Safe system.   [name] was instructed to call 911 with any severe reactions post vaccine: Marland Kitchen Difficulty breathing  . Swelling of face and throat  . A fast heartbeat  . A bad rash all over body  . Dizziness and weakness

## 2020-03-03 ENCOUNTER — Ambulatory Visit: Payer: Managed Care, Other (non HMO) | Attending: Internal Medicine

## 2020-03-03 DIAGNOSIS — Z23 Encounter for immunization: Secondary | ICD-10-CM

## 2020-03-03 NOTE — Progress Notes (Signed)
   Covid-19 Vaccination Clinic  Name:  Joan Harris    MRN: [id number] DOB: Jun 09, 1986  03/03/2020  Joan Harris was observed post Covid-19 immunization for 15 minutes without incident. She was provided with Vaccine Information Sheet and instruction to access the V-Safe system.   Joan Harris was instructed to call 911 with any severe reactions post vaccine: Marland Kitchen Difficulty breathing  . Swelling of face and throat  . A fast heartbeat  . A bad rash all over body  . Dizziness and weakness   Immunizations Administered    Name Date Dose VIS Date Route   Pfizer COVID-19 Vaccine 03/03/2020  5:02 PM 0.3 mL 11/08/2019 Intramuscular   Manufacturer: [employer], [employer]   Lot: [id number]   NDC: [address]-5

## 2023-06-17 ENCOUNTER — Ambulatory Visit: Payer: Managed Care, Other (non HMO)

## 2023-06-17 ENCOUNTER — Encounter: Payer: Self-pay | Admitting: Emergency Medicine

## 2023-06-17 ENCOUNTER — Other Ambulatory Visit: Payer: Self-pay

## 2023-06-17 ENCOUNTER — Ambulatory Visit
Admission: EM | Admit: 2023-06-17 | Discharge: 2023-06-17 | Disposition: A | Discharge status: Home / Self Care | Payer: Managed Care, Other (non HMO) | Attending: Family Medicine | Admitting: Family Medicine

## 2023-06-17 DIAGNOSIS — R109 Unspecified abdominal pain: Secondary | ICD-10-CM | POA: Diagnosis not present

## 2023-06-17 DIAGNOSIS — R1084 Generalized abdominal pain: Secondary | ICD-10-CM | POA: Diagnosis not present

## 2023-06-17 MED ORDER — KETOROLAC TROMETHAMINE 30 MG/ML IJ SOLN
30.0000 mg | Freq: Once | INTRAMUSCULAR | Status: AC
  Administered 2023-06-17: 30 mg via INTRAMUSCULAR

## 2023-06-17 NOTE — ED Triage Notes (Signed)
Patient c/o abdominal pain since this am after having a BM.  Patient became really hot and nauseous.  Pain in radiating from belly button down to top part of her vagina.  Pain also radiates from side to side.  Denies UA sx's.

## 2023-06-17 NOTE — Discharge Instructions (Addendum)
Home to rest Lots of clear liquid May advance to bland foods when you feel better If you get worse, go to ER

## 2023-06-17 NOTE — ED Provider Notes (Signed)
[facility]    CSN: [id number] Arrival date & time: 06/17/23  1257      History   Chief Complaint Chief Complaint  Patient presents with   Abdominal Pain    HPI Joan Harris is a 37 y.o. female.   HPI  Patient woke up this morning.  She states she went to the bathroom had a normal bowel movement and emptied her bladder.  Shortly thereafter she developed abdominal pain.  She has been having abdominal pain ever since then.  It is central in her no appetite, she is only had a couple sips of water.  She has never had pain like this before.  She has not had any abdominal surgeries.  She has Nexplanon for birth control.  She is in a stable relationship and not worried about STDs.  No urinary symptoms.  No vaginal discharge.  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      Home Medications    Prior to Admission medications   Medication Sig Start Date End Date Taking? Authorizing Provider  etonogestrel (NEXPLANON) 68 MG IMPL implant 68 mg. 12/15/14  Yes [provider]    Family History Family History  Problem Relation Age of Onset   Heart disease Mother    Hypertension Mother    Hyperlipidemia Mother    Hypertension Sister    Asthma Daughter    Breast cancer Maternal Grandmother     Social History Social History   Tobacco Use   Smoking status:  Former    Types: Cigars   Smokeless tobacco: Never  Vaping Use   Vaping status: Never Used  Substance Use Topics   Alcohol use: Not Currently    Comment: special occasions   Drug use: No     Allergies   Patient has no known allergies.   Review of Systems Review of Systems See HPI  Physical Exam Triage Vital Signs ED Triage Vitals  Encounter Vitals Group     BP 06/17/23 1313 (!) 148/93     Systolic BP Percentile --      Diastolic BP Percentile --      Pulse Rate 06/17/23 1313 70     Resp 06/17/23 1313 18     Temp 06/17/23 1313 98.5 F (36.9 C)     Temp Source 06/17/23 1313 Oral     SpO2 06/17/23 1313 100 %     Weight 06/17/23 1318 273 lb (123.8 kg)     Height 06/17/23 1318 5\' 9"  (1.753 m)     Head Circumference --      Peak Flow --      Pain Score 06/17/23 1315 5     Pain Loc --      Pain Education --      Exclude from Growth Chart --  No data found.  Updated Vital Signs BP (!) 148/93 (BP Location: Right Arm)   Pulse 70   Temp 98.5 F (36.9 C) (Oral)   Resp 18   Ht 5\' 9"  (1.753 m)   Wt 123.8 kg   LMP 05/18/2023   SpO2 100%   BMI 40.32 kg/m   Physical Exam Constitutional:      General: She is not in acute distress.    Appearance: She is well-developed. She is obese. She is ill-appearing.     Comments: Uncomfortable.  Guarded movements  HENT:     Head: Normocephalic and atraumatic.  Eyes:     Conjunctiva/sclera: Conjunctivae normal.     Pupils: Pupils are equal, round, and reactive to light.  Cardiovascular:     Rate and Rhythm: Normal rate and regular rhythm.  Pulmonary:     Effort: Pulmonary effort is normal. No respiratory distress.     Breath sounds: Normal breath sounds.  Abdominal:     General: Abdomen is protuberant. Bowel sounds are normal. There is no distension.     Palpations: Abdomen is soft. There is no hepatomegaly or splenomegaly.     Tenderness: There is generalized abdominal tenderness. There is guarding and rebound.   Musculoskeletal:        General: Normal range of motion.     Cervical back: Normal range of motion.  Skin:    General: Skin is warm and dry.  Neurological:     Mental Status: She is alert.      UC Treatments / Results  Labs (all labs ordered are listed, but only abnormal results are displayed) Labs Reviewed - No data to display  EKG   Radiology DG Abdomen 1 View  Result Date: 06/17/2023 CLINICAL DATA:  Acute abdominal pain. EXAM: ABDOMEN - 1 VIEW COMPARISON:  None Available. FINDINGS: The bowel gas pattern is normal. No radio-opaque calculi or other significant radiographic abnormality are seen. IMPRESSION: Negative. Electronically Signed   By: Larose Hires D.O.   On: 06/17/2023 14:00    Procedures Procedures (including critical care time)  Medications Ordered in UC Medications  ketorolac (TORADOL) 30 MG/ML injection 30 mg (has no administration in time range)    Initial Impression / Assessment and Plan / UC Course  I have reviewed the triage vital signs and the nursing notes.  Pertinent labs & imaging results that were available during my care of the patient were reviewed by me and considered in my medical decision making (see chart for details).     Patient has diffuse abdominal pain with guarding and rebound.  Difficult to do an accurate abdominal examination due to her tenderness and body habitus.  A KUB was done and is negative.  Will give her a shot of Toradol for the pain and send her home on a clear liquid diet.  She understands that if she gets worse instead of better she needs to go to emergency room for additional imaging Final Clinical Impressions(s) / UC Diagnoses   Final diagnoses:  Acute abdominal pain     Discharge Instructions      Home to rest Lots of clear liquid May advance to bland foods when you feel better If you get worse, go to ER   ED Prescriptions   None    PDMP not reviewed this encounter.   Eustace Moore, MD 06/17/23  [phone]

## 2024-04-25 ENCOUNTER — Ambulatory Visit (INDEPENDENT_AMBULATORY_CARE_PROVIDER_SITE_OTHER): Admitting: Family Medicine

## 2024-04-25 VITALS — BP 118/78 | HR 68 | Temp 98.5°F | Ht 70.0 in | Wt 241.0 lb

## 2024-04-25 DIAGNOSIS — Z13220 Encounter for screening for lipoid disorders: Secondary | ICD-10-CM | POA: Diagnosis not present

## 2024-04-25 DIAGNOSIS — Z7689 Persons encountering health services in other specified circumstances: Secondary | ICD-10-CM | POA: Diagnosis not present

## 2024-04-25 DIAGNOSIS — Z13228 Encounter for screening for other metabolic disorders: Secondary | ICD-10-CM | POA: Diagnosis not present

## 2024-04-25 DIAGNOSIS — Z136 Encounter for screening for cardiovascular disorders: Secondary | ICD-10-CM

## 2024-04-25 DIAGNOSIS — Z23 Encounter for immunization: Secondary | ICD-10-CM | POA: Insufficient documentation

## 2024-04-25 DIAGNOSIS — Z0000 Encounter for general adult medical examination without abnormal findings: Secondary | ICD-10-CM

## 2024-04-25 DIAGNOSIS — Z1329 Encounter for screening for other suspected endocrine disorder: Secondary | ICD-10-CM

## 2024-04-25 DIAGNOSIS — Z1159 Encounter for screening for other viral diseases: Secondary | ICD-10-CM | POA: Insufficient documentation

## 2024-04-25 NOTE — Progress Notes (Signed)
 Complete physical exam  Patient: Joan Harris   DOB: February 17, 1986   39 y.o. Female  MRN: [id number]  Subjective:     Chief Complaint  Patient presents with   Establish Care   Annual Exam    Joan Harris is a 38 y.o. female who presents today for a complete physical exam. She reports consuming a portion control  diet. Walking 3 days per week She generally feels well. She reports sleeping well. She does not have additional problems to discuss today.   [place]: was on a weight loss semaglutide, has lost 43 pounds.   Right hand: has seen specialist in past. Never pain that shoot through hand. Gets random lump that resolves on  its own. No symptoms currently.   Red rash on legs that do not itch and resolve on their own.  Negative genetic testing for breast cancer.     Most recent fall risk assessment:    04/25/2024    4:27 PM  Fall Risk   Falls in the past year? 0     Most recent depression screenings:    04/25/2024    4:27 PM 01/21/2019    2:02 PM  PHQ 2/9 Scores  PHQ - 2 Score 0 1  PHQ- 9 Score 0     Vision:Not within last year  and Dental: No current dental problems and Receives regular dental care    Patient Care Team: Mickiel Albany, FNP as PCP - General (Family Medicine)   Outpatient Medications Prior to Visit  Medication Sig   etonogestrel (NEXPLANON) 68 MG IMPL implant 68 mg.   No facility-administered medications prior to visit.    ROS        Objective:     BP 118/78 (BP Location: Right Arm, Patient Position: Sitting, Cuff Size: Large)   Pulse 68   Temp 98.5 F (36.9 C) (Oral)   Ht 5\' 10"  (1.778 m)   Wt 241 lb (109.3 kg)   LMP 03/21/2024 (Approximate)   SpO2 100%   BMI 34.58 kg/m  BP Readings from Last 3 Encounters:  04/25/24 118/78  06/17/23 (!) 148/93  01/21/19 128/84      Physical Exam Vitals and nursing note reviewed.  Constitutional:      General: She is not in acute distress.    Appearance: Normal appearance.   HENT:     Right Ear: Tympanic membrane normal.     Left Ear: Tympanic membrane normal.     Nose: Nose normal.     Mouth/Throat:     Mouth: Mucous membranes are moist.     Pharynx: Oropharynx is clear.  Eyes:     Extraocular Movements: Extraocular movements intact.  Neck:     Thyroid : No thyroid  tenderness.  Cardiovascular:     Rate and Rhythm: Normal rate and regular rhythm.     Pulses:          Radial pulses are 2+ on the right side and 2+ on the left side.     Heart sounds: Normal heart sounds, S1 normal and S2 normal.  Pulmonary:     Effort: Pulmonary effort is normal.     Breath sounds: Normal breath sounds.  Abdominal:     General: Bowel sounds are normal.     Palpations: Abdomen is soft.     Tenderness: There is no abdominal tenderness.  Musculoskeletal:        General: Normal range of motion.     Cervical back: Normal range of motion.  Right lower leg: No edema.     Left lower leg: No edema.  Lymphadenopathy:     Cervical:     Right cervical: No superficial cervical adenopathy.    Left cervical: No superficial cervical adenopathy.  Skin:    General: Skin is warm and dry.  Neurological:     General: No focal deficit present.     Mental Status: She is alert. Mental status is at baseline.  Psychiatric:        Mood and Affect: Mood normal.        Behavior: Behavior normal.        Thought Content: Thought content normal.        Judgment: Judgment normal.      No results found for any visits on 04/25/24.      Assessment & Plan:    Routine Health Maintenance and Physical Exam  Immunization History  Administered Date(s) Administered   Influenza-Unspecified 09/22/2015, 07/29/2016   PFIZER(Purple Top)SARS-COV-2 Vaccination 01/31/2020, 03/03/2020, 12/03/2020   Pneumococcal Conjugate-13 09/22/2015   Tdap 01/21/2019    Health Maintenance  Topic Date Due   Hepatitis C Screening  Never done   COVID-19 Vaccine (4 - 2024-25 season) 07/30/2023   INFLUENZA  VACCINE  06/28/2024   Cervical Cancer Screening (HPV/Pap Cotest)  02/26/2026   DTaP/Tdap/Td (2 - Td or Tdap) 01/21/2029   HIV Screening  Completed   Pneumococcal Vaccine 12-40 Years old  Aged Out   HPV VACCINES  Aged Out   Meningococcal B Vaccine  Aged Out    Discussed health benefits of physical activity, and encouraged her to engage in regular exercise appropriate for her age and condition.  Annual physical exam -     CBC -     Comprehensive metabolic panel with GFR  Establishing care with new doctor, encounter for  Encounter for lipid screening for cardiovascular disease -     Lipid panel  Encounter for screening for metabolic disorder -     Comprehensive metabolic panel with GFR -     Hemoglobin A1c  Screening for thyroid  disorder -     TSH + free T4  Screening for viral disease -     Hepatitis C antibody      Routine labs ordered.  HCM reviewed/discussed. Hep C ordered. Anticipatory guidance regarding healthy weight, lifestyle and choices given. Recommend healthy diet.  Recommend approximately 150 minutes/week of moderate intensity exercise. Resistance training is good for building muscles and for bone health. Muscle mass helps to increase our metabolism and to burn more calories at rest.  Limit alcohol consumption: no more than one drink per day for women and 2 drinks per day for me. Recommend regular dental and vision exams. Always use seatbelt/lap and shoulder restraints. Recommend using smoke alarms and checking batteries at least twice a year. Recommend using sunscreen when outside.  Please know that I am here to help you with all of your health care goals and am happy to work with you to find a solution that works best for you.  The greatest advice I have received with any changes in life are to take it one step at a time, that even means if all you can focus on is the next 60 seconds, then do that and celebrate your victories.  With any changes in life, you  will have set backs, and that is OK. The important thing to remember is, if you have a set back, it is not a failure, it is an  opportunity to try again! Agrees with plan of care discussed.  Questions answered.      Return in about 1 year (around 04/28/2025) for CPE with labs.     Mickiel Albany, FNP

## 2024-04-26 ENCOUNTER — Ambulatory Visit: Payer: Self-pay | Admitting: Family Medicine

## 2024-04-26 ENCOUNTER — Other Ambulatory Visit: Payer: Self-pay | Admitting: Family Medicine

## 2024-04-26 DIAGNOSIS — E66811 Obesity, class 1: Secondary | ICD-10-CM

## 2024-04-26 DIAGNOSIS — R252 Cramp and spasm: Secondary | ICD-10-CM | POA: Insufficient documentation

## 2024-04-26 DIAGNOSIS — N946 Dysmenorrhea, unspecified: Secondary | ICD-10-CM

## 2024-04-26 LAB — COMPREHENSIVE METABOLIC PANEL WITH GFR
ALT: 18 IU/L (ref 0–32)
AST: 25 IU/L (ref 0–40)
Albumin: 4.4 g/dL (ref 3.9–4.9)
Alkaline Phosphatase: 76 IU/L (ref 44–121)
BUN/Creatinine Ratio: 16 (ref 9–23)
BUN: 13 mg/dL (ref 6–20)
Bilirubin Total: 0.3 mg/dL (ref 0.0–1.2)
CO2: 22 mmol/L (ref 20–29)
Calcium: 9.5 mg/dL (ref 8.7–10.2)
Chloride: 104 mmol/L (ref 96–106)
Creatinine, Ser: 0.81 mg/dL (ref 0.57–1.00)
Globulin, Total: 2.9 g/dL (ref 1.5–4.5)
Glucose: 85 mg/dL (ref 70–99)
Potassium: 4.5 mmol/L (ref 3.5–5.2)
Sodium: 141 mmol/L (ref 134–144)
Total Protein: 7.3 g/dL (ref 6.0–8.5)
eGFR: 95 mL/min/{1.73_m2} (ref >59)

## 2024-04-26 LAB — CBC
Hematocrit: 37.7 % (ref 34.0–46.6)
Hemoglobin: 12.4 g/dL (ref 11.1–15.9)
MCH: 30.8 pg (ref 26.6–33.0)
MCHC: 32.9 g/dL (ref 31.5–35.7)
MCV: 94 fL (ref 79–97)
Platelets: 345 10*3/uL (ref 150–450)
RBC: 4.03 x10E6/uL (ref 3.77–5.28)
RDW: 12.9 % (ref 11.7–15.4)
WBC: 6.3 10*3/uL (ref 3.4–10.8)

## 2024-04-26 LAB — TSH+FREE T4
Free T4: 1.21 ng/dL (ref 0.82–1.77)
TSH: 0.965 u[IU]/mL (ref 0.450–4.500)

## 2024-04-26 LAB — HEPATITIS C ANTIBODY: Hep C Virus Ab: NONREACTIVE

## 2024-04-26 LAB — LIPID PANEL
Chol/HDL Ratio: 2.8 ratio (ref 0.0–4.4)
Cholesterol, Total: 187 mg/dL (ref 100–199)
HDL: 66 mg/dL (ref >39)
LDL Chol Calc (NIH): 110 mg/dL — ABNORMAL HIGH (ref 0–99)
Triglycerides: 61 mg/dL (ref 0–149)
VLDL Cholesterol Cal: 11 mg/dL (ref 5–40)

## 2024-04-26 LAB — HEMOGLOBIN A1C
Est. average glucose Bld gHb Est-mCnc: 103 mg/dL
Hgb A1c MFr Bld: 5.2 % (ref 4.8–5.6)

## 2024-04-26 MED ORDER — IBUPROFEN 800 MG PO TABS
800.0000 mg | ORAL_TABLET | Freq: Three times a day (TID) | ORAL | 1 refills | Status: DC | PRN
Start: 2024-04-26 — End: 2024-09-09

## 2024-04-26 MED ORDER — PHENTERMINE HCL 37.5 MG PO CAPS: 37.5000 mg | ORAL_CAPSULE | ORAL | 0 refills | Status: DC

## 2024-06-08 ENCOUNTER — Telehealth: Admitting: Nurse Practitioner

## 2024-06-08 DIAGNOSIS — R399 Unspecified symptoms and signs involving the genitourinary system: Secondary | ICD-10-CM

## 2024-06-08 MED ORDER — NITROFURANTOIN MONOHYD MACRO 100 MG PO CAPS
100.0000 mg | ORAL_CAPSULE | Freq: Two times a day (BID) | ORAL | 0 refills | Status: AC
Start: 2024-06-08 — End: 2024-06-13

## 2024-06-08 NOTE — Progress Notes (Signed)
 I have spent 5 minutes in review of e-visit questionnaire, review and updating patient chart, medical decision making and response to patient.   Claiborne Rigg, NP

## 2024-06-08 NOTE — Progress Notes (Signed)

## 2024-07-23 ENCOUNTER — Telehealth

## 2024-07-23 ENCOUNTER — Other Ambulatory Visit: Payer: Self-pay | Admitting: Nurse Practitioner

## 2024-07-23 ENCOUNTER — Telehealth: Admitting: Physician Assistant

## 2024-07-23 DIAGNOSIS — M545 Low back pain, unspecified: Secondary | ICD-10-CM

## 2024-07-23 DIAGNOSIS — R399 Unspecified symptoms and signs involving the genitourinary system: Secondary | ICD-10-CM

## 2024-07-23 MED ORDER — NAPROXEN 500 MG PO TABS: 500.0000 mg | ORAL_TABLET | Freq: Two times a day (BID) | ORAL | 0 refills | Status: DC

## 2024-07-23 MED ORDER — CYCLOBENZAPRINE HCL 10 MG PO TABS
10.0000 mg | ORAL_TABLET | Freq: Three times a day (TID) | ORAL | 0 refills | Status: DC | PRN
Start: 2024-07-23 — End: 2024-08-12

## 2024-07-23 NOTE — Progress Notes (Signed)
 E-Visit for Back Pain   We are sorry that you are not feeling well.  Here is how we plan to help!  Based on what you have shared with me it looks like you mostly have acute back pain.  Acute back pain is defined as musculoskeletal pain that can resolve in 1-3 weeks with conservative treatment.  I have prescribed Naprosyn 500 mg take one by mouth twice a day non-steroid anti-inflammatory (NSAID) as well as Flexeril 10 mg every eight hours as needed which is a muscle relaxer  Some patients experience stomach irritation or in increased heartburn with anti-inflammatory drugs.  Please keep in mind that muscle relaxer's can cause fatigue and should not be taken while at work or driving.  Back pain is very common.  The pain often gets better over time.  The cause of back pain is usually not dangerous.  Most people can learn to manage their back pain on their own.  Home Care Stay active.  Start with short walks on flat ground if you can.  Try to walk farther each day. Do not sit, drive or stand in one place for more than 30 minutes.  Do not stay in bed. Do not avoid exercise or work.  Activity can help your back heal faster. Be careful when you bend or lift an object.  Bend at your knees, keep the object close to you, and do not twist. Sleep on a firm mattress.  Lie on your side, and bend your knees.  If you lie on your back, put a pillow under your knees. Only take medicines as told by your doctor. Put ice on the injured area. Put ice in a plastic bag Place a towel between your skin and the bag Leave the ice on for 15-20 minutes, 3-4 times a day for the first 2-3 days. 210 After that, you can switch between ice and heat packs. Ask your doctor about back exercises or massage. Avoid feeling anxious or stressed.  Find good ways to deal with stress, such as exercise.  Get Help Right Way If: Your pain does not go away with rest or medicine. Your pain does not go away in 1 week. You have new  problems. You do not feel well. The pain spreads into your legs. You cannot control when you poop (bowel movement) or pee (urinate) You feel sick to your stomach (nauseous) or throw up (vomit) You have belly (abdominal) pain. You feel like you may pass out (faint). If you develop a fever.  Make Sure you: Understand these instructions. Will watch your condition Will get help right away if you are not doing well or get worse.  Your e-visit answers were reviewed by a board certified advanced clinical practitioner to complete your personal care plan.  Depending on the condition, your plan could have included both over the counter or prescription medications.  If there is a problem please reply  once you have received a response from your provider.  Your safety is important to us .  If you have drug allergies check your prescription carefully.    You can use MyChart to ask questions about today's visit, request a non-urgent call back, or ask for a work or school excuse for 24 hours related to this e-Visit. If it has been greater than 24 hours you will need to follow up with your provider, or enter a new e-Visit to address those concerns.  You will get an e-mail in the next two days asking about  your experience.  I hope that your e-visit has been valuable and will speed your recovery. Thank you for using e-visits.

## 2024-07-23 NOTE — Progress Notes (Signed)
 I have spent 5 minutes in review of e-visit questionnaire, review and updating patient chart, medical decision making and response to patient.   Elsie Velma Lunger, PA-C

## 2024-08-01 ENCOUNTER — Telehealth: Admitting: Nurse Practitioner

## 2024-08-01 DIAGNOSIS — M5442 Lumbago with sciatica, left side: Secondary | ICD-10-CM | POA: Diagnosis not present

## 2024-08-01 MED ORDER — PREDNISONE 10 MG (21) PO TBPK: ORAL_TABLET | ORAL | 0 refills | Status: DC

## 2024-08-01 NOTE — Progress Notes (Signed)
 Virtual Visit Consent   Joan Harris, you are scheduled for a virtual visit with a [facility] provider today. Just as with appointments in the office, your consent must be obtained to participate. Your consent will be active for this visit and any virtual visit you may have with one of our providers in the next 365 days. If you have a MyChart account, a copy of this consent can be sent to you electronically.  As this is a virtual visit, video technology does not allow for your provider to perform a traditional examination. This may limit your provider's ability to fully assess your condition. If your provider identifies any concerns that need to be evaluated in person or the need to arrange testing (such as labs, EKG, etc.), we will make arrangements to do so. Although advances in technology are sophisticated, we cannot ensure that it will always work on either your end or our end. If the connection with a video visit is poor, the visit may have to be switched to a telephone visit. With either a video or telephone visit, we are not always able to ensure that we have a secure connection.  By engaging in this virtual visit, you consent to the provision of healthcare and authorize for your insurance to be billed (if applicable) for the services provided during this visit. Depending on your insurance coverage, you may receive a charge related to this service.  I need to obtain your verbal consent now. Are you willing to proceed with your visit today? Joan Harris has provided verbal consent on 08/01/2024 for a virtual visit (video or telephone). Lauraine Kitty, FNP  Date: 08/01/2024 11:52 AM   Virtual Visit via Video Note   I, Lauraine Kitty, connected with  Joan Harris  ([id number], December 25, 1985) on 08/01/24 at 12:00 PM EDT by a video-enabled telemedicine application and verified that I am speaking with the correct person using two identifiers.  Location: Patient: Virtual Visit Location  Patient: Home Provider: Virtual Visit Location Provider: Home Office   I discussed the limitations of evaluation and management by telemedicine and the availability of in person appointments. The patient expressed understanding and agreed to proceed.    History of Present Illness: Joan Harris is a 38 y.o. who identifies as a female who was assigned female at birth, and is being seen today for back pain.   She most recently was seen via EV on 07/22/24 and was given Naprosyn  and Flexeril .  The back pain originated in her lower back and she thought that it was more related to her menstraul cycle. She now has pain in her left thigh.  She feels the pain radiate from her lower back to her left buttock to her left thigh.   She denies any recent trauma, new exercise routine or heavy lifting   Denies any weakness or numbness   She is getting married in one week and then leaving the country   Problems:  Patient Active Problem List   Diagnosis Date Noted   Menstrual cramps 04/26/2024   Obesity (BMI 30.0-34.9) 04/26/2024   Screening for thyroid  disorder 04/25/2024   Screening for viral disease 04/25/2024   Left ankle injury, subsequent encounter 11/06/2017   Chronic low back pain 08/17/2015   Acanthosis nigricans 06/26/2013   Other malaise and fatigue 06/26/2013   Obesity, morbid (HCC) 06/26/2013   GERD (gastroesophageal reflux disease) 06/26/2013   Headaches, cluster 03/14/2013    Allergies: No Known Allergies Medications:  Current Outpatient Medications:  cyclobenzaprine  (FLEXERIL ) 10 MG tablet, Take 1 tablet (10 mg total) by mouth 3 (three) times daily as needed., Disp: 15 tablet, Rfl: 0   etonogestrel (NEXPLANON) 68 MG IMPL implant, 68 mg., Disp: , Rfl:    ibuprofen  (ADVIL ) 800 MG tablet, Take 1 tablet (800 mg total) by mouth every 8 (eight) hours as needed for cramping or moderate pain (pain score 4-6). Take with food., Disp: 30 tablet, Rfl: 1   naproxen  (NAPROSYN ) 500 MG  tablet, Take 1 tablet (500 mg total) by mouth 2 (two) times daily with a meal., Disp: 20 tablet, Rfl: 0   phentermine  37.5 MG capsule, Take 1 capsule (37.5 mg total) by mouth every morning., Disp: 30 capsule, Rfl: 0  Observations/Objective: Patient is well-developed, well-nourished in no acute distress.  Resting comfortably  at home.  Head is normocephalic, atraumatic.  No labored breathing.  Speech is clear and coherent with logical content.  Patient is alert and oriented at baseline.    Assessment and Plan:  1. Acute left-sided low back pain with left-sided sciatica (Primary) - predniSONE  (STERAPRED UNI-PAK 21 TAB) 10 MG (21) TBPK tablet; Take 6 tablets on day one, 5 on day two, 4 on day three, 3 on day four, 2 on day five, and 1 on day six. Take with food.  Dispense: 21 tablet; Refill: 0   Advised patient on orthopedic UC in her area if pain is not improving or with onset of numbness or weakness     Follow Up Instructions: I discussed the assessment and treatment plan with the patient. The patient was provided an opportunity to ask questions and all were answered. The patient agreed with the plan and demonstrated an understanding of the instructions.  A copy of instructions were sent to the patient via MyChart unless otherwise noted below.     The patient was advised to call back or seek an in-person evaluation if the symptoms worsen or if the condition fails to improve as anticipated.    Lauraine Kitty, FNP

## 2024-08-12 ENCOUNTER — Ambulatory Visit (INDEPENDENT_AMBULATORY_CARE_PROVIDER_SITE_OTHER): Admitting: Family Medicine

## 2024-08-12 ENCOUNTER — Encounter: Payer: Self-pay | Admitting: Family Medicine

## 2024-08-12 VITALS — BP 155/93 | HR 91 | Temp 99.7°F | Ht 69.0 in | Wt 244.0 lb

## 2024-08-12 DIAGNOSIS — R252 Cramp and spasm: Secondary | ICD-10-CM | POA: Diagnosis not present

## 2024-08-12 DIAGNOSIS — Z23 Encounter for immunization: Secondary | ICD-10-CM

## 2024-08-12 MED ORDER — METHOCARBAMOL 500 MG PO TABS: 500.0000 mg | ORAL_TABLET | Freq: Four times a day (QID) | ORAL | 0 refills | Status: DC | PRN

## 2024-08-12 NOTE — Progress Notes (Signed)
   Acute Office Visit  Subjective:     Patient ID: Joan Harris, female    DOB: [birth year], 38 y.o.   MRN: [id number]  Chief Complaint  Patient presents with   Leg Pain    Cramps in leg. Wakes me up. From my thighs.  My knees buckled. I could not move. Both legs crumpled and I could not walk.  Needs refills on Flexeril   Will take flu shot today    HPI Patient is in today for cramps in legs. Great toe sticks straight up. Over the past year, the frequency has increased. Last 5 months, now in both of legs in foot and ankle.  Happens  at night.  Taking potassium, magnesium gummies without resolution. Fell to the floor with last episode. Drinks water. Flexeril  causes her to go to sleep. TENS unit has not helped.       ROS      Objective:    BP (!) 155/93   Pulse 91   Temp 99.7 F (37.6 C) (Oral)   Ht 5' 9 (1.753 m)   Wt 244 lb (110.7 kg)   LMP 07/21/2024 (Exact Date)   SpO2 99%   BMI 36.03 kg/m    Physical Exam Vitals and nursing note reviewed.  Constitutional:      General: She is not in acute distress.    Appearance: Normal appearance.  Cardiovascular:     Rate and Rhythm: Normal rate.  Pulmonary:     Effort: Pulmonary effort is normal.  Skin:    General: Skin is warm and dry.  Neurological:     General: No focal deficit present.     Mental Status: She is alert. Mental status is at baseline.  Psychiatric:        Mood and Affect: Mood normal.        Behavior: Behavior normal.        Thought Content: Thought content normal.        Judgment: Judgment normal.     No results found for any visits on 08/12/24.      Assessment & Plan:   Problem List Items Addressed This Visit     Encounter for administration of vaccine - Primary   Relevant Orders   Flu vaccine trivalent PF, 6mos and older(Flulaval,Afluria,Fluarix,Fluzone) (Completed)   Muscle cramping   Muscle cramping for past year, worse recently. Both legs. Always happens at  night. Has tried potassium, and magnesium without resolution in symptoms. Will get BMP and magnesium tomorrow. She will go to [employer] close to her work. Flexeril  has not helped. Robaxin  500 mg as needed every 6 hours. Referral to orthopedics for evaluation.       Relevant Medications   methocarbamol  (ROBAXIN ) 500 MG tablet   Other Relevant Orders   Basic metabolic panel with GFR   Magnesium   Ambulatory referral to Orthopedics    Meds ordered this encounter  Medications   methocarbamol  (ROBAXIN ) 500 MG tablet    Sig: Take 1 tablet (500 mg total) by mouth every 6 (six) hours as needed for muscle spasms.    Dispense:  90 tablet    Refill:  0    Supervising Provider:   METHENEY, CATHERINE D [[id number]]  Agrees with plan of care discussed.  Questions answered.   Return if symptoms worsen or fail to improve.  Darice JONELLE Brownie, FNP

## 2024-08-12 NOTE — Assessment & Plan Note (Signed)
 Muscle cramping for past year, worse recently. Both legs. Always happens at night. Has tried potassium, and magnesium without resolution in symptoms. Will get BMP and magnesium tomorrow. She will go to [employer] close to her work. Flexeril  has not helped. Robaxin  500 mg as needed every 6 hours. Referral to orthopedics for evaluation.

## 2024-08-14 ENCOUNTER — Ambulatory Visit: Payer: Self-pay | Admitting: Family Medicine

## 2024-08-14 LAB — BASIC METABOLIC PANEL WITH GFR
BUN/Creatinine Ratio: 17 (ref 9–23)
BUN: 14 mg/dL (ref 6–20)
CO2: 22 mmol/L (ref 20–29)
Calcium: 9 mg/dL (ref 8.7–10.2)
Chloride: 105 mmol/L (ref 96–106)
Creatinine, Ser: 0.84 mg/dL (ref 0.57–1.00)
Glucose: 87 mg/dL (ref 70–99)
Potassium: 4 mmol/L (ref 3.5–5.2)
Sodium: 141 mmol/L (ref 134–144)
eGFR: 91 mL/min/1.73 (ref >59)

## 2024-08-14 LAB — MAGNESIUM: Magnesium: 2.2 mg/dL (ref 1.6–2.3)

## 2024-08-23 ENCOUNTER — Ambulatory Visit: Admitting: Physician Assistant

## 2024-08-23 ENCOUNTER — Encounter: Payer: Self-pay | Admitting: Physician Assistant

## 2024-08-23 ENCOUNTER — Other Ambulatory Visit: Payer: Self-pay

## 2024-08-23 DIAGNOSIS — G8929 Other chronic pain: Secondary | ICD-10-CM | POA: Diagnosis not present

## 2024-08-23 DIAGNOSIS — M5441 Lumbago with sciatica, right side: Secondary | ICD-10-CM | POA: Diagnosis not present

## 2024-08-23 DIAGNOSIS — M5442 Lumbago with sciatica, left side: Secondary | ICD-10-CM | POA: Diagnosis not present

## 2024-08-23 DIAGNOSIS — R252 Cramp and spasm: Secondary | ICD-10-CM | POA: Diagnosis not present

## 2024-08-23 NOTE — Progress Notes (Signed)
 Office Visit Note   Patient: Joan Harris           Date of Birth: [birth year]           MRN: [id number] Visit Date: 08/23/2024              Requested by: Booker Darice SAUNDERS, FNP [address] [place],  [place] [address] PCP: Booker Darice SAUNDERS, FNP   Assessment & Plan: Visit Diagnoses:  1. Chronic bilateral low back pain with sciatica, sciatica laterality unspecified     Plan: Patient is a pleasant 38 year old woman who presents today in referral for evaluation of bilateral left greater than right foot and leg cramping.  With some associated low back pain.  Most of her cramping almost always occurs at night.  It prevents her from sleeping.  She has had extensive lab work from her primary care provider which did not demonstrate any electrolyte imbalances.  She has been trying to take more magnesium she is also tried tonic water she has also been on a course of steroids with a little improvement.  She is also tried anti-inflammatories without much improvement.  I think that like given her presentation would like to first get electrodiagnostic studies of her lower extremities to rule out any peripheral neuropathy issues.  Will make a referral for that.  If this does not demonstrate anything could consider physical therapy and an MRI we will contact her with the results  Follow-Up Instructions: Will contact with results  Orders:  Orders Placed This Encounter  Procedures   XR Lumbar Spine 2-3 Views   No orders of the defined types were placed in this encounter.     Procedures: No procedures performed   Clinical Data: No additional findings.   Subjective: Chief Complaint  Patient presents with   Right Leg - Pain   Left Leg - Pain    HPI patient is a pleasant 38 year old woman with a one 1 month history of increasing cramping in her left greater than right leg and most profoundly in her feet.  This occurs noticeably at night.  Does not really have problems during the day.  She  was evaluated by her primary care with labs did not see any electrolyte deficiency.  Denies any weakness.  Denies any difficulties when she is doing things during the day.  Has had history of some low back pain denies any loss of bowel or bladder control denies any particular injuries  Review of Systems  All other systems reviewed and are negative.    Objective: Vital Signs: LMP 07/21/2024 (Exact Date)   Physical Exam Constitutional:      Appearance: Normal appearance.  Pulmonary:     Effort: Pulmonary effort is normal.  Skin:    General: Skin is warm and dry.  Neurological:     General: No focal deficit present.     Mental Status: She is alert and oriented to person, place, and time.  Psychiatric:        Mood and Affect: Mood normal.        Behavior: Behavior normal.     Ortho Exam Exam today she has no pain with flexion extension of her limbs and back.  She has good strength bilaterally.  She does have a mild straight leg raise on the left but otherwise is intact both from a motor standpoint and sensory standpoint.  No current spasms again she says she gets is cramping only at night compartments are soft and nontender  negative [place]' sign Specialty Comments:  No specialty comments available.  Imaging: No results found.   PMFS History: Patient Active Problem List   Diagnosis Date Noted   Muscle cramping 04/26/2024   Obesity (BMI 30.0-34.9) 04/26/2024   Encounter for administration of vaccine 04/25/2024   Screening for viral disease 04/25/2024   Left ankle injury, subsequent encounter 11/06/2017   Chronic low back pain 08/17/2015   Acanthosis nigricans 06/26/2013   Other malaise and fatigue 06/26/2013   Obesity, morbid (HCC) 06/26/2013   GERD (gastroesophageal reflux disease) 06/26/2013   Headaches, cluster 03/14/2013   Past Medical History:  Diagnosis Date   Back pain    Obesity    Persistent headaches    Uterine cramping     Family History  Problem  Relation Age of Onset   Heart disease Mother    Hypertension Mother    Hyperlipidemia Mother    Hypertension Sister    Asthma Daughter    Breast cancer Maternal Grandmother     Past Surgical History:  Procedure Laterality Date   BARTHOLIN GLAND CYST EXCISION     Social History   Occupational History    Employer: [employer]    Comment: [employer]  Tobacco Use   Smoking status: Former    Types: Cigars   Smokeless tobacco: Never  Vaping Use   Vaping status: Never Used  Substance and Sexual Activity   Alcohol use: Not Currently    Comment: special occasions   Drug use: No   Sexual activity: Yes    Birth control/protection: Implant

## 2024-08-29 ENCOUNTER — Encounter: Payer: Self-pay | Admitting: Family Medicine

## 2024-08-30 ENCOUNTER — Encounter: Payer: Self-pay | Admitting: Neurology

## 2024-08-30 ENCOUNTER — Other Ambulatory Visit: Payer: Self-pay

## 2024-08-30 DIAGNOSIS — R202 Paresthesia of skin: Secondary | ICD-10-CM

## 2024-09-08 ENCOUNTER — Other Ambulatory Visit: Payer: Self-pay | Admitting: Family Medicine

## 2024-09-08 DIAGNOSIS — N946 Dysmenorrhea, unspecified: Secondary | ICD-10-CM

## 2024-09-17 ENCOUNTER — Other Ambulatory Visit: Payer: Self-pay | Admitting: Family Medicine

## 2024-09-17 DIAGNOSIS — E66811 Obesity, class 1: Secondary | ICD-10-CM

## 2024-09-30 ENCOUNTER — Encounter: Payer: Self-pay | Admitting: Radiology

## 2024-09-30 ENCOUNTER — Encounter: Payer: Self-pay | Admitting: Family Medicine

## 2024-10-01 DIAGNOSIS — G8929 Other chronic pain: Secondary | ICD-10-CM

## 2024-10-04 ENCOUNTER — Telehealth: Payer: Self-pay | Admitting: Sports Medicine

## 2024-10-04 NOTE — Telephone Encounter (Signed)
 Order has been faxed to [facility] [place]. I did talk to pt as well.

## 2024-10-04 NOTE — Telephone Encounter (Signed)
 Patient called and said that you sent out a referral for MRI but the place saying they don't have her anywhere in the system. CB#[phone]

## 2024-10-04 NOTE — Telephone Encounter (Signed)
 Tried calling pt vm was full.

## 2024-10-15 ENCOUNTER — Other Ambulatory Visit: Payer: Self-pay | Admitting: Physician Assistant

## 2024-10-15 DIAGNOSIS — R252 Cramp and spasm: Secondary | ICD-10-CM

## 2024-10-15 DIAGNOSIS — M5441 Lumbago with sciatica, right side: Secondary | ICD-10-CM

## 2024-10-21 ENCOUNTER — Other Ambulatory Visit: Payer: Self-pay | Admitting: Family Medicine

## 2024-10-21 DIAGNOSIS — E66811 Obesity, class 1: Secondary | ICD-10-CM

## 2024-10-22 NOTE — Telephone Encounter (Signed)
 Please call to schedule office visit before phentermine  can be prescribed. Needs weight loss documented in chart. Thank you, Darice

## 2024-10-22 NOTE — Telephone Encounter (Signed)
 Called patient for a medication refill appointment and patient stated that she can't take any days off because she just started a new job.

## 2024-11-13 ENCOUNTER — Other Ambulatory Visit: Payer: Self-pay | Admitting: Family Medicine

## 2024-11-13 ENCOUNTER — Encounter: Payer: Self-pay | Admitting: Family Medicine

## 2024-11-13 DIAGNOSIS — Z1231 Encounter for screening mammogram for malignant neoplasm of breast: Secondary | ICD-10-CM

## 2024-11-18 ENCOUNTER — Ambulatory Visit: Admitting: Neurology

## 2024-11-18 DIAGNOSIS — R202 Paresthesia of skin: Secondary | ICD-10-CM | POA: Diagnosis not present

## 2024-11-18 NOTE — Procedures (Signed)
 " [facility]  [address] [place], [address]  [place], [place] [address] Tel: [phone] Fax: [phone] Test Date:  11/18/2024  Patient: Joan Harris DOB: Jun 02, 1986 Physician: Venetia Potters, MD  Sex: Female Height: 5' 9 Ref Phys: Ronal Dragon Persons, [place]  ID#: [id number]   Technician:    History: This is a 38 year old with back pain and leg cramping.  NCV & EMG Findings: Extensive electrodiagnostic evaluation of bilateral lower limbs shows: Bilateral sural and superficial peroneal/fibular sensory responses are within normal limits. Bilateral tibal (AH) motor responses show reduced amplitude (L1.81, R1.49 mV). Bilateral peroneal/fibular (EDB) motor responses are within normal limits. Bilateral H reflex latencies are within normal limits. There is no evidence of active or chronic motor axon loss changes affecting any of the tested muscles on needle examination. Motor unit configuration and recruitment pattern is within normal limits.  Impression: This study shows no significant abnormalities. Specifically: No electrodiagnostic evidence of a large fiber sensorimotor neuropathy. No electrodiagnostic evidence of a left or right lumbosacral (L3-S1) motor radiculopathy. Bilateral low amplitude tibial (AH) motor responses are of unclear clinical significance. Given the otherwise normal examination, including normal needle examination of bilateral AH muscles, this finding may be technical in nature or represent an anatomic variation.    ___________________________ Venetia Potters, MD    Nerve Conduction Studies Motor Nerve Results    Latency Amplitude F-Lat Segment Distance CV Comment  Site (ms) Norm (mV) Norm (ms)  (cm) (m/s) Norm   Left Fibular (EDB) Motor  Ankle 3.6  < 5.5 6.6  > 3.0        Bel fib head 12.0 - 5.4 -  Bel fib head-Ankle 36 43  > 40   Pop fossa 14.0 - 5.2 -  Pop fossa-Bel fib head 9 45 -   Right Fibular (EDB) Motor  Ankle 3.0  < 5.5 8.8  > 3.0         Bel fib head 11.2 - 7.1 -  Bel fib head-Ankle 36.5 45  > 40   Pop fossa 12.9 - 6.6 -  Pop fossa-Bel fib head 9 53 -   Left Tibial (AH) Motor  Ankle 4.9  < 6.0 *1.81  > 8.0        Knee 13.8 - 2.2 -  Knee-Ankle 43 48  > 40   Right Tibial (AH) Motor  Ankle 3.9  < 6.0 *1.49  > 8.0        Knee 10.5 - 2.1 -  Knee-Ankle 45.5 69  > 40    Sensory Sites    Neg Peak Lat Amplitude (O-P) Segment Distance Velocity Comment  Site (ms) Norm (V) Norm  (cm) (ms)   Left Superficial Fibular Sensory  14 cm-Ankle 3.5  < 4.5 6  > 5 14 cm-Ankle 14    Right Superficial Fibular Sensory  14 cm-Ankle 2.9  < 4.5 9  > 5 14 cm-Ankle 14    Left Sural Sensory  Calf-Lat mall 3.7  < 4.5 5  > 5 Calf-Lat mall 14    Right Sural Sensory  Calf-Lat mall 3.8  < 4.5 6  > 5 Calf-Lat mall 14     H-Reflex Results    M-Lat H Lat H Neg Amp H-M Lat  Site (ms) (ms) Norm (mV) (ms)  Left Tibial H-Reflex  Pop fossa 6.6 33.6  < 35.0 2.1 27.0  Right Tibial H-Reflex  Pop fossa 6.2 32.7  < 35.0 1.65 26.5   Electromyography  Side Muscle Ins.Act Fibs Fasc Recrt Amp Dur Poly Activation Comment  Left Tib ant Nml Nml Nml Nml Nml Nml Nml Nml N/A  Left Gastroc MH Nml Nml Nml Nml Nml Nml Nml Nml N/A  Left FDL Nml Nml Nml Nml Nml Nml Nml Nml N/A  Left Vastus lat Nml Nml Nml Nml Nml Nml Nml Nml N/A  Left AH Nml Nml Nml Nml Nml Nml Nml Nml N/A  Right Tib ant Nml Nml Nml Nml Nml Nml Nml Nml N/A  Right Gastroc MH Nml Nml Nml Nml Nml Nml Nml Nml N/A  Right FDL Nml Nml Nml Nml Nml Nml Nml Nml N/A  Right Rectus fem Nml Nml Nml Nml Nml Nml Nml Nml N/A  Right AH Nml Nml Nml Nml Nml Nml Nml Nml N/A      Waveforms:  Motor           Sensory           H-Reflex      "

## 2024-11-25 ENCOUNTER — Encounter: Payer: Self-pay | Admitting: Family Medicine

## 2024-11-25 ENCOUNTER — Ambulatory Visit: Admitting: Family Medicine

## 2024-11-25 ENCOUNTER — Other Ambulatory Visit: Payer: Self-pay | Admitting: Family Medicine

## 2024-11-25 VITALS — BP 118/74 | HR 87 | Ht 69.0 in | Wt 238.0 lb

## 2024-11-25 DIAGNOSIS — E66812 Obesity, class 2: Secondary | ICD-10-CM

## 2024-11-25 DIAGNOSIS — Z6835 Body mass index (BMI) 35.0-35.9, adult: Secondary | ICD-10-CM | POA: Diagnosis not present

## 2024-11-25 DIAGNOSIS — E66811 Obesity, class 1: Secondary | ICD-10-CM

## 2024-11-25 MED ORDER — PHENTERMINE HCL 37.5 MG PO CAPS: 37.5000 mg | ORAL_CAPSULE | Freq: Every morning | ORAL | 1 refills | Status: AC

## 2024-11-25 MED ORDER — TIRZEPATIDE-WEIGHT MANAGEMENT 2.5 MG/0.5ML ~~LOC~~ SOLN: 2.5000 mg | SUBCUTANEOUS | 3 refills | Status: DC

## 2024-11-25 NOTE — Assessment & Plan Note (Signed)
 Prescribed Phentermine  37.5 mg daily. Tolerating well. Weight loss: reports being told she has lost weight. Does not weigh self at home. 6 pounds lost since last office visit with PCP.  Goal: 215 pounds  Walks on lunch breaks, goal 3 miles day. Endorses diet changes, trying to eat half of previous portions. Increased water intake.  PDMP reviewed: no red flags  Discussed Zepbound  thru [employer] program. She is interested in this.  No history of thyroid  cancer.  Prescription sent. Follow-up in 3 months, sooner if anything changes.

## 2024-11-25 NOTE — Progress Notes (Signed)
" ° °  Established Patient Office Visit  Subjective   Patient ID: Joan Harris, female    DOB: 11-01-1986  Age: 38 y.o. MRN: [id number]  Chief Complaint  Patient presents with   Medical Management of Chronic Issues    Prescribed Phentermine  37.5 mg daily. Tolerating well. Weight loss: reports being told she has lost weight. Does not weigh self at home. 6 pounds lost since last office visit with PCP.  Goal: 215 pounds  Walks on lunch breaks, goal 3 miles day. Endorses diet changes, trying to eat half of previous portions. Increased water intake.  PDMP reviewed: no red flags  Discussed Zepbound  thru [employer] program. She is interested in this.  No history of thyroid  cancer.  Prescription sent. Follow-up in 3 months, sooner if anything changes.        ROS    Objective:     BP 118/74 (Patient Position: Sitting, Cuff Size: Large)   Pulse 87   Ht 5' 9 (1.753 m)   Wt 238 lb (108 kg)   SpO2 99%   BMI 35.15 kg/m    Physical Exam Vitals and nursing note reviewed.  Constitutional:      Appearance: Normal appearance.  Pulmonary:     Effort: Pulmonary effort is normal.  Skin:    General: Skin is warm and dry.  Neurological:     General: No focal deficit present.     Mental Status: She is alert. Mental status is at baseline.  Psychiatric:        Mood and Affect: Mood normal.        Behavior: Behavior normal.        Thought Content: Thought content normal.        Judgment: Judgment normal.      No results found for any visits on 11/25/24.    The 10-year ASCVD risk score (Arnett DK, et al., 2019) is: 0.2%    Assessment & Plan:   Problem List Items Addressed This Visit     Class 2 obesity without serious comorbidity with body mass index (BMI) of 35.0 to 35.9 in adult - Primary   Prescribed Phentermine  37.5 mg daily. Tolerating well. Weight loss: reports being told she has lost weight. Does not weigh self at home. 6 pounds lost since  last office visit with PCP.  Goal: 215 pounds  Walks on lunch breaks, goal 3 miles day. Endorses diet changes, trying to eat half of previous portions. Increased water intake.  PDMP reviewed: no red flags  Discussed Zepbound  thru [employer] program. She is interested in this.  No history of thyroid  cancer.  Prescription sent. Follow-up in 3 months, sooner if anything changes.        Relevant Medications   tirzepatide  (ZEPBOUND ) 2.5 MG/0.5ML injection vial  Agrees with plan of care discussed.  Questions answered.   Return in about 3 months (around 02/23/2025) for obesity .    Joan JONELLE Brownie, FNP  "

## 2024-11-29 ENCOUNTER — Other Ambulatory Visit: Payer: Self-pay | Admitting: Family Medicine

## 2024-11-29 DIAGNOSIS — E66812 Obesity, class 2: Secondary | ICD-10-CM

## 2024-11-29 MED ORDER — SEMAGLUTIDE 3 MG PO TABS: 1.5000 mg | ORAL_TABLET | Freq: Every day | ORAL | 0 refills | Status: DC

## 2024-12-02 ENCOUNTER — Other Ambulatory Visit: Payer: Self-pay | Admitting: Family Medicine

## 2024-12-02 DIAGNOSIS — E66812 Obesity, class 2: Secondary | ICD-10-CM

## 2024-12-02 DIAGNOSIS — Z1231 Encounter for screening mammogram for malignant neoplasm of breast: Secondary | ICD-10-CM

## 2024-12-02 MED ORDER — RYBELSUS 3 MG PO TABS: 3.0000 mg | ORAL_TABLET | Freq: Every day | ORAL | 0 refills | Status: DC

## 2024-12-03 ENCOUNTER — Ambulatory Visit: Payer: Self-pay | Admitting: Physician Assistant

## 2024-12-03 ENCOUNTER — Other Ambulatory Visit: Payer: Self-pay | Admitting: Family Medicine

## 2024-12-03 ENCOUNTER — Telehealth: Payer: Self-pay

## 2024-12-03 ENCOUNTER — Telehealth: Payer: Self-pay | Admitting: Radiology

## 2024-12-03 ENCOUNTER — Other Ambulatory Visit (HOSPITAL_COMMUNITY): Payer: Self-pay

## 2024-12-03 NOTE — Progress Notes (Signed)
Can you let her know that?

## 2024-12-03 NOTE — Telephone Encounter (Signed)
 Pharmacy Patient Advocate Encounter   Received notification from [employer] KEY that prior authorization for Semaglutide  (RYBELSUS ) 3 MG TABS  is required/requested.   Insurance verification completed.   The patient is insured through [employer].   Per test claim: PA required; PA submitted to above mentioned insurance via Latent Key/confirmation #/EOC [id number] Status is pending

## 2024-12-03 NOTE — Telephone Encounter (Signed)
 Patient called, said Joan Harris called her, and told her that the MRI order was faxed to [facility].  She called them and they do not have the order.  Please call pt to discuss and please refax MRI order.  Sending to you as she said she had msg from you- please forward to correct person if not you?  Thanks.

## 2024-12-03 NOTE — Progress Notes (Signed)
 I called and left message letting pt know this did not require authorization and also left number to [facility] to call and schedule the aptp

## 2024-12-04 ENCOUNTER — Other Ambulatory Visit: Payer: Self-pay | Admitting: Family Medicine

## 2024-12-04 DIAGNOSIS — K649 Unspecified hemorrhoids: Secondary | ICD-10-CM | POA: Insufficient documentation

## 2024-12-04 MED ORDER — HYDROCORTISONE ACETATE 25 MG RE SUPP: 25.0000 mg | Freq: Two times a day (BID) | RECTAL | 1 refills | Status: AC

## 2024-12-04 NOTE — Telephone Encounter (Signed)
 Refaxed again to main office with [facility] Faxed order & demographics to [facility], for [place] location, [phone] .

## 2024-12-06 NOTE — Telephone Encounter (Signed)
 Refaxed to [place] with [facility]. [place] location claims never received the order.

## 2024-12-09 ENCOUNTER — Encounter (HOSPITAL_BASED_OUTPATIENT_CLINIC_OR_DEPARTMENT_OTHER): Payer: Self-pay

## 2024-12-09 ENCOUNTER — Ambulatory Visit (HOSPITAL_BASED_OUTPATIENT_CLINIC_OR_DEPARTMENT_OTHER)
Admission: RE | Admit: 2024-12-09 | Discharge: 2024-12-09 | Disposition: A | Discharge status: Home / Self Care | Source: Ambulatory Visit | Attending: Family Medicine | Admitting: Family Medicine

## 2024-12-09 DIAGNOSIS — Z1231 Encounter for screening mammogram for malignant neoplasm of breast: Secondary | ICD-10-CM | POA: Insufficient documentation

## 2024-12-09 NOTE — Telephone Encounter (Signed)
 Pharmacy Patient Advocate Encounter  Received notification from [facility]  that Prior Authorization for (RYBELSUS ) 3 MG TABS has been DENIED.  See denial reason below. No denial letter attached in LATENT.  Will attach denial letter to Media tab once received.   PA #/Case ID/Reference #: PA Case: [id number] STATUS: DENIED   PLEASE BE ADVISED Rybelsus  3MG  Tablet is denied. It does not meet medical necessity. RYBELSUS  3MG  TABLET HAS BEEN REQUESTED FOR THE TREATMENT OF OBESITY. The information provided by your prescriber does not meet [employer]'s guideline. The guideline used is: Glucagon-like peptide-1 Agonists Prior Authorization with Quantity Limit Criteria. Information provided does not show that the policy requirements have been met. The requirement(s) not met include: A condition of type 2 diabetes mellitus; AND diagnosis has been confirmed by lab tests (e.g., A1C greater than or equal to 6.5%); AND ONE of the following: - Trial and an inadequate response to an agent containing metformin or insulin ; OR - confirmation the person is too sensitive to metformin or insulin ; OR - confirmation the person have a Food and Drug Administration labeled sign of sickness or condition that prevent the use of BOTH metformin AND insulin ; OR - Both of the following: - You have had serious heart or blood vessel problems beforelike a heart attack, stroke, or surgery to fix blocked arteriesor you have several things that raise their risk, like high blood pressure, high cholesterol, smoking, or a family history of heart disease; AND - the medicine is being used to help lower the chance of serious heart problems like a heart attack, stroke, or dying from heart disease; OR - Both of the following: - You have long-term kidney disease; AND - the medicine will help slow down kidney damage, prevent kidney failure, and lower the risk of dying from heart problems; Therefore, coverage for Rybelsus  3MG  Tablet is denied.  Questions? Contact [phone]

## 2024-12-09 NOTE — Progress Notes (Signed)
 Left msg on pt's voicemail and advised her as directed by maryanne- she will recv call to schedule appt with megan via dr newtons office

## 2024-12-10 NOTE — Telephone Encounter (Signed)
 Pt has been advised. Pt would like to schedule a TOC appointment as soon as possible with Whitney Crain or Joy Jessup due to personal schedule. Pt was advised [facility] will reach out as soon as possible in order to schedule appointment. Pt states she recently began a new job and would like to be seen early on Monday mornings.

## 2024-12-11 ENCOUNTER — Ambulatory Visit: Payer: Self-pay | Admitting: Family Medicine

## 2024-12-11 NOTE — Telephone Encounter (Signed)
 Contacted patient she is now scheduled with Whitney Crain

## 2024-12-12 ENCOUNTER — Encounter (INDEPENDENT_AMBULATORY_CARE_PROVIDER_SITE_OTHER): Payer: Self-pay | Admitting: Urgent Care

## 2024-12-12 DIAGNOSIS — Z79899 Other long term (current) drug therapy: Secondary | ICD-10-CM

## 2024-12-12 DIAGNOSIS — E66811 Obesity, class 1: Secondary | ICD-10-CM

## 2024-12-13 MED ORDER — WEGOVY 1.5 MG PO TABS: 1.5000 mg | ORAL_TABLET | Freq: Every day | ORAL | 0 refills | Status: AC

## 2024-12-13 NOTE — Telephone Encounter (Signed)

## 2024-12-16 ENCOUNTER — Ambulatory Visit: Admitting: Physical Medicine and Rehabilitation

## 2024-12-16 ENCOUNTER — Encounter: Payer: Self-pay | Admitting: Physical Medicine and Rehabilitation

## 2024-12-16 DIAGNOSIS — M7918 Myalgia, other site: Secondary | ICD-10-CM | POA: Diagnosis not present

## 2024-12-16 DIAGNOSIS — M546 Pain in thoracic spine: Secondary | ICD-10-CM

## 2024-12-16 DIAGNOSIS — M5442 Lumbago with sciatica, left side: Secondary | ICD-10-CM | POA: Diagnosis not present

## 2024-12-16 DIAGNOSIS — G8929 Other chronic pain: Secondary | ICD-10-CM

## 2024-12-16 NOTE — Progress Notes (Signed)
 Core Outcome Measures Index (COMI) Back Score  Average Pain 0  COMI Score 0 %

## 2024-12-16 NOTE — Progress Notes (Signed)
 Pain Scale   Average Pain 0 Patient advising she has lower back pain and is here for MRI review        +Driver, -BT, -Dye Allergies.

## 2024-12-16 NOTE — Progress Notes (Signed)
 "  Joan Harris - 39 y.o. female MRN [id number]  Date of birth: Sep 08, 1986  Office Visit Note: Visit Date: 12/16/2024 PCP: Booker Darice SAUNDERS, FNP Referred by: Booker Darice SAUNDERS, FNP  Subjective: Chief Complaint  Patient presents with   Lower Back - Pain   HPI: Joan Harris is a 39 y.o. female who comes in today per the request of Ronal Dragon Persons, PA for evaluation of chronic left sided lower back pain radiating to buttock and lateral thigh to knee. She is here today for evaluation, to discuss recent lumbar MRI imaging and bilateral lower extremity nerve study. Pain ongoing for several years. States she had episode of severe lower back pain while grocery shopping about 8-9 years ago. Most recent onset of pain was August of 2025. States she was sitting at desk when pain started. Her pain typically does resolve with time, however this time she experienced left sided radicular symptoms, which was concerning to her. Some relief of pain with home exercise regimen, rest and use of medications. History of formal chiropractic treatments with minimal relief of pain. No history of formal physical therapy. She denies pain at this time.  She also reports issues with middle back pain radiating to upper back/shoulders. This has also been intermittent over the years. Patient denies focal weakness, numbness and tingling. No recent trauma or falls.      Review of Systems  Musculoskeletal:  Positive for back pain and myalgias.  Neurological:  Negative for tingling, sensory change, focal weakness and weakness.  All other systems reviewed and are negative.  Otherwise per HPI.  Assessment & Plan: Visit Diagnoses:    ICD-10-CM   1. Chronic left-sided low back pain with left-sided sciatica  M54.42 Ambulatory referral to Physical Therapy   G89.29     2. Chronic bilateral thoracic back pain  M54.6 Ambulatory referral to Physical Therapy   G89.29     3. Myofascial pain syndrome  M79.18 Ambulatory  referral to Physical Therapy       Plan: Findings:  Chronic left sided lower back pain radiating to buttock and lateral thigh to knee.  Left sided lower back pain with left-sided radicular symptoms has resolved with time and continued home exercise regimen.  She does have pain to the middle of her thoracic spine that radiates up to her shoulders that is more intermittent in nature.  I discussed recent lumbar MRI with her today.  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  No significant nerve impingement noted.  No high-grade central canal stenosis.  There are some early degenerative changes however fairly good-looking MRI for her age.  Also discussed recent bilateral lower extremity nerve study with Dr. Leigh.  This was a normal nerve study with no evidence of neuropathy/radiculopathy.  I do think her symptoms are more myofascial related.  She does have a job where she sits for long periods.  She is fairly sedentary at home.  This I did place order for short course of formal physical therapy with a focus on manual treatments, core strengthening and establishing home exercise regimen.  I am hopeful that she can strengthen her back and this will hopefully decrease the occurrence of acute exacerbations.  I explained to her that I do not recommend any type of interventional spine procedures at this time as there is not any significant nerve issues in her lower back.  She has no questions at this time.  Her exam today  is nonfocal, good strength noted to bilateral lower extremities.  There is myofascial tenderness upon palpation of bilateral rhomboid and trapezius regions today.  She can follow-up with us  as needed.    Meds & Orders: No orders of the defined types were placed in this encounter.   Orders Placed This Encounter  Procedures   Ambulatory referral to Physical Therapy    Follow-up: Return if symptoms worsen or fail to improve.   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 ------------------------------------ Bilateral Lower Extremity NCV with EMG Dr. Venetia [facility]) 11/18/2024  Impression: 1. No electrodiagnostic evidence of a large fiber sensorimotor neuropathy.   2.  No electrodiagnostic evidence of a left or right lumbosacral motor radiculopathy. 3.  Bilateral low amplitude tibial motor responses are of unclear clinical significance.  Given the otherwise normal examination, including normal needle examination of bilateral AH muscles, this finding may be technical in nature or represent an anatomic variation.   She reports that she has quit smoking. Her smoking use included cigars. She has never used smokeless tobacco.  Recent Labs    04/25/24 1616  HGBA1C 5.2    Objective:  VS:  HT:    WT:   BMI:     BP:   HR: bpm  TEMP: ( )  RESP:  Physical Exam Vitals and nursing note reviewed.  HENT:     Head: Normocephalic and atraumatic.     Right Ear: External ear normal.     Left Ear: External ear normal.     Nose: Nose normal.     Mouth/Throat:     Mouth: Mucous membranes are moist.  Eyes:     Extraocular Movements: Extraocular movements intact.  Cardiovascular:     Rate and Rhythm: Normal rate.     Pulses: Normal pulses.  Pulmonary:     Effort: Pulmonary effort is normal.  Abdominal:     General: Abdomen is flat.  Musculoskeletal:        General: Tenderness present.     Cervical back: Normal range of motion.     Comments: Patient rises from seated position to standing without difficulty. Good lumbar range of motion. No pain noted with facet loading. 5/5 strength noted with bilateral hip flexion, knee flexion/extension, ankle dorsiflexion/plantarflexion and EHL. No clonus noted bilaterally. No pain upon palpation of greater trochanters. No pain with  internal/external rotation of bilateral hips. Sensation intact bilaterally.  Myofascial tenderness noted to bilateral rhomboid and trapezius regions upon palpation today.  Negative slump test bilaterally. Ambulates without aid, gait steady.     Skin:    General: Skin is warm and dry.     Capillary Refill: Capillary refill takes less than 2 seconds.  Neurological:     General: No focal deficit present.     Mental Status: She is alert and oriented to person, place, and time.  Psychiatric:        Mood and Affect: Mood normal.        Behavior: Behavior normal.     Ortho Exam  Imaging: No results found.  Past Medical/Family/Surgical/Social History: Medications & Allergies reviewed per EMR, new medications updated. Patient Active Problem List   Diagnosis Date Noted   Hemorrhoids 12/04/2024   Muscle cramping 04/26/2024   Obesity (BMI 30.0-34.9) 04/26/2024   Encounter for screening mammogram for malignant neoplasm of breast 04/25/2024  Screening for viral disease 04/25/2024   Left ankle injury, subsequent encounter 11/06/2017   Chronic low back pain 08/17/2015   Acanthosis nigricans 06/26/2013   Other malaise and fatigue 06/26/2013   Class 2 obesity without serious comorbidity with body mass index (BMI) of 35.0 to 35.9 in adult 06/26/2013   GERD (gastroesophageal reflux disease) 06/26/2013   Headaches, cluster 03/14/2013   Past Medical History:  Diagnosis Date   Back pain    Obesity    Persistent headaches    Uterine cramping    Family History  Problem Relation Age of Onset   Heart disease Mother    Hypertension Mother    Hyperlipidemia Mother    Hypertension Sister    Asthma Daughter    Breast cancer Maternal Grandmother    Past Surgical History:  Procedure Laterality Date   BARTHOLIN GLAND CYST EXCISION     Social History   Occupational History    Employer: [employer]    Comment: [employer]  Tobacco Use   Smoking status: Former    Types: Cigars    Smokeless tobacco: Never  Vaping Use   Vaping status: Never Used  Substance and Sexual Activity   Alcohol use: Not Currently    Comment: special occasions   Drug use: No   Sexual activity: Yes    Birth control/protection: Implant   "

## 2024-12-18 ENCOUNTER — Other Ambulatory Visit: Payer: Self-pay | Admitting: Urgent Care

## 2024-12-18 DIAGNOSIS — N946 Dysmenorrhea, unspecified: Secondary | ICD-10-CM

## 2024-12-27 ENCOUNTER — Ambulatory Visit

## 2024-12-30 ENCOUNTER — Ambulatory Visit

## 2025-01-13 ENCOUNTER — Ambulatory Visit: Admitting: Physical Therapy

## 2025-01-20 ENCOUNTER — Ambulatory Visit: Admitting: Physical Therapy

## 2025-01-27 ENCOUNTER — Encounter: Admitting: Urgent Care

## 2025-02-03 ENCOUNTER — Ambulatory Visit: Admitting: Physical Therapy

## 2025-04-28 ENCOUNTER — Encounter: Admitting: Family Medicine
